# Patient Record
Sex: Female | Born: 1965 | Race: Black or African American | Hispanic: No | Smoking: Never smoker
Health system: Southern US, Community
[De-identification: ages and names within clinical notes are randomized; demographics above are authoritative.]

## PROBLEM LIST (undated history)

## (undated) DIAGNOSIS — G43809 Other migraine, not intractable, without status migrainosus: Secondary | ICD-10-CM

## (undated) DIAGNOSIS — G8929 Other chronic pain: Secondary | ICD-10-CM

## (undated) DIAGNOSIS — K219 Gastro-esophageal reflux disease without esophagitis: Secondary | ICD-10-CM

## (undated) DIAGNOSIS — I10 Essential (primary) hypertension: Secondary | ICD-10-CM

## (undated) DIAGNOSIS — M545 Low back pain, unspecified: Secondary | ICD-10-CM

## (undated) DIAGNOSIS — A6000 Herpesviral infection of urogenital system, unspecified: Secondary | ICD-10-CM

## (undated) DIAGNOSIS — J4520 Mild intermittent asthma, uncomplicated: Secondary | ICD-10-CM

## (undated) HISTORY — DX: Gastro-esophageal reflux disease without esophagitis: K21.9

## (undated) HISTORY — DX: Other chronic pain: G89.29

## (undated) HISTORY — DX: Other migraine, not intractable, without status migrainosus: G43.809

## (undated) HISTORY — DX: Herpesviral infection of urogenital system, unspecified: A60.00

## (undated) HISTORY — DX: Mild intermittent asthma, uncomplicated: J45.20

## (undated) HISTORY — DX: Essential (primary) hypertension: I10

## (undated) HISTORY — DX: Low back pain, unspecified: M54.50

## (undated) HISTORY — DX: Low back pain: M54.5

---

## 2004-01-28 ENCOUNTER — Ambulatory Visit: Payer: Self-pay

## 2004-06-30 ENCOUNTER — Ambulatory Visit: Payer: Self-pay | Admitting: Internal Medicine

## 2004-07-29 ENCOUNTER — Ambulatory Visit: Payer: Self-pay

## 2004-10-28 ENCOUNTER — Ambulatory Visit: Payer: Self-pay | Admitting: Internal Medicine

## 2004-12-08 ENCOUNTER — Ambulatory Visit: Payer: Self-pay | Admitting: Internal Medicine

## 2005-07-05 ENCOUNTER — Ambulatory Visit: Payer: Self-pay | Admitting: Family Medicine

## 2005-09-16 ENCOUNTER — Ambulatory Visit: Payer: Self-pay | Admitting: Unknown Physician Specialty

## 2006-10-21 ENCOUNTER — Other Ambulatory Visit: Payer: Self-pay

## 2006-10-21 ENCOUNTER — Emergency Department: Payer: Self-pay | Admitting: Emergency Medicine

## 2006-11-30 DIAGNOSIS — G8929 Other chronic pain: Secondary | ICD-10-CM | POA: Insufficient documentation

## 2007-04-14 LAB — HM MAMMOGRAPHY: HM Mammogram: NORMAL

## 2009-04-27 ENCOUNTER — Emergency Department: Payer: Self-pay | Admitting: Emergency Medicine

## 2011-07-16 ENCOUNTER — Emergency Department: Payer: Self-pay | Admitting: Emergency Medicine

## 2011-07-16 LAB — COMPREHENSIVE METABOLIC PANEL WITH GFR
Albumin: 3.8 g/dL
Alkaline Phosphatase: 61 U/L
Anion Gap: 9
BUN: 15 mg/dL
Bilirubin,Total: 0.3 mg/dL
Calcium, Total: 9 mg/dL
Chloride: 104 mmol/L
Co2: 27 mmol/L
Creatinine: 0.93 mg/dL
EGFR (African American): >60
EGFR (Non-African Amer.): >60
Glucose: 91 mg/dL
Osmolality: 280
Potassium: 3.7 mmol/L
SGOT(AST): 25 U/L
SGPT (ALT): 23 U/L
Sodium: 140 mmol/L
Total Protein: 8.2 g/dL

## 2011-07-16 LAB — CBC
HCT: 36.5 %
HGB: 12.1 g/dL
MCH: 26.8 pg
MCHC: 33.1 g/dL
MCV: 81 fL
Platelet: 245 10*3/uL
RBC: 4.52 X10 6/mm 3
RDW: 14.3 %
WBC: 5.6 10*3/uL

## 2011-07-16 LAB — CK TOTAL AND CKMB (NOT AT ARMC): CK, Total: 244 U/L — ABNORMAL HIGH (ref 21–215)

## 2012-06-12 LAB — HM PAP SMEAR: HM PAP: NORMAL

## 2012-06-20 ENCOUNTER — Emergency Department: Payer: Self-pay | Admitting: Emergency Medicine

## 2012-06-21 LAB — CBC
MCHC: 32.1 g/dL (ref 32.0–36.0)
MCV: 81 fL (ref 80–100)
RBC: 4.14 10*6/uL (ref 3.80–5.20)
RDW: 15.4 % — ABNORMAL HIGH (ref 11.5–14.5)

## 2012-06-21 LAB — BASIC METABOLIC PANEL
Calcium, Total: 8.6 mg/dL (ref 8.5–10.1)
Creatinine: 0.7 mg/dL (ref 0.60–1.30)
EGFR (African American): >60
EGFR (Non-African Amer.): >60
Osmolality: 275 (ref 275–301)
Potassium: 3.2 mmol/L — ABNORMAL LOW (ref 3.5–5.1)
Sodium: 138 mmol/L (ref 136–145)

## 2012-06-21 LAB — CK TOTAL AND CKMB (NOT AT ARMC): CK-MB: 2 ng/mL (ref 0.5–3.6)

## 2012-11-01 ENCOUNTER — Emergency Department: Payer: Self-pay | Admitting: Emergency Medicine

## 2012-12-08 ENCOUNTER — Observation Stay: Payer: Self-pay | Admitting: Internal Medicine

## 2012-12-08 LAB — BASIC METABOLIC PANEL
Chloride: 103 mmol/L (ref 98–107)
EGFR (African American): >60
EGFR (Non-African Amer.): >60
Osmolality: 274 (ref 275–301)
Sodium: 136 mmol/L (ref 136–145)

## 2012-12-08 LAB — TROPONIN I
Troponin-I: <0.02 ng/mL
Troponin-I: <0.02 ng/mL

## 2012-12-08 LAB — CBC
HCT: 37.4 % (ref 35.0–47.0)
HGB: 12.4 g/dL (ref 12.0–16.0)
MCH: 26.4 pg (ref 26.0–34.0)
MCHC: 33.2 g/dL (ref 32.0–36.0)
MCV: 80 fL (ref 80–100)
RBC: 4.69 10*6/uL (ref 3.80–5.20)
RDW: 14.3 % (ref 11.5–14.5)

## 2012-12-08 LAB — HEPATIC FUNCTION PANEL A (ARMC)
Albumin: 3.9 g/dL (ref 3.4–5.0)
Bilirubin, Direct: <0.1 mg/dL (ref 0.00–0.20)

## 2012-12-09 LAB — LIPID PANEL
Ldl Cholesterol, Calc: 99 mg/dL (ref 0–100)
VLDL Cholesterol, Calc: 44 mg/dL — ABNORMAL HIGH (ref 5–40)

## 2012-12-09 LAB — COMPREHENSIVE METABOLIC PANEL
Alkaline Phosphatase: 67 U/L (ref 50–136)
Anion Gap: 7 (ref 7–16)
BUN: 13 mg/dL (ref 7–18)
Calcium, Total: 9 mg/dL (ref 8.5–10.1)
Chloride: 102 mmol/L (ref 98–107)
Creatinine: 0.86 mg/dL (ref 0.60–1.30)
EGFR (African American): >60
EGFR (Non-African Amer.): >60
Glucose: 149 mg/dL — ABNORMAL HIGH (ref 65–99)
Osmolality: 273 (ref 275–301)
Potassium: 3.4 mmol/L — ABNORMAL LOW (ref 3.5–5.1)
Total Protein: 7.3 g/dL (ref 6.4–8.2)

## 2012-12-09 LAB — CBC WITH DIFFERENTIAL/PLATELET
Basophil #: 0.1 10*3/uL (ref 0.0–0.1)
Eosinophil #: 0.3 10*3/uL (ref 0.0–0.7)
Lymphocyte #: 2.1 10*3/uL (ref 1.0–3.6)
Lymphocyte %: 32 %
Neutrophil %: 55.8 %
Platelet: 251 10*3/uL (ref 150–440)
RDW: 14.4 % (ref 11.5–14.5)
WBC: 6.2 10*3/uL (ref 3.6–11.0)

## 2012-12-09 LAB — TSH: Thyroid Stimulating Horm: 0.802 u[IU]/mL

## 2013-05-18 LAB — HEPATIC FUNCTION PANEL
ALT: 17 U/L (ref 7–35)
AST: 22 U/L (ref 13–35)
Alkaline Phosphatase: 54 U/L (ref 25–125)
Bilirubin, Total: 0.4 mg/dL

## 2013-05-18 LAB — TSH: TSH: 0.5 u[IU]/mL (ref 0.41–5.90)

## 2013-05-18 LAB — BASIC METABOLIC PANEL
BUN: 14 mg/dL (ref 4–21)
Creatinine: 0.8 mg/dL (ref 0.5–1.1)
Glucose: 98 mg/dL
Potassium: 4.5 mmol/L (ref 3.4–5.3)
Sodium: 138 mmol/L (ref 137–147)

## 2013-05-18 LAB — LIPID PANEL
CHOLESTEROL: 201 mg/dL — AB (ref 0–200)
HDL: 52 mg/dL (ref 35–70)
LDL Cholesterol: 134 mg/dL
LDl/HDL Ratio: 2.6
Triglycerides: 75 mg/dL (ref 40–160)

## 2013-05-18 LAB — CBC AND DIFFERENTIAL
HEMATOCRIT: 37 % (ref 36–46)
Hemoglobin: 12.4 g/dL (ref 12.0–16.0)
Neutrophils Absolute: 61 /uL
Platelets: 294 10*3/uL (ref 150–399)
WBC: 4.7 10*3/mL

## 2013-05-18 LAB — HEMOGLOBIN A1C: HEMOGLOBIN A1C: 6.1 % — AB (ref 4.0–6.0)

## 2013-05-21 IMAGING — CR DG CHEST 2V
1 series · 2 of 2 positions shown · non-contrast
Comparison: none

REASON FOR EXAM: Shortness of Breath
COMMENTS:   May transport without cardiac monitor

PROCEDURE:     DXR - DXR CHEST PA (OR AP) AND LATERAL  - July 16, 2011  [DATE]
RESULT:     Comparison: None.

[Series 1: w chest pa · 0.14mm/px · 2 of 2 slices shown]
[im 1/2]
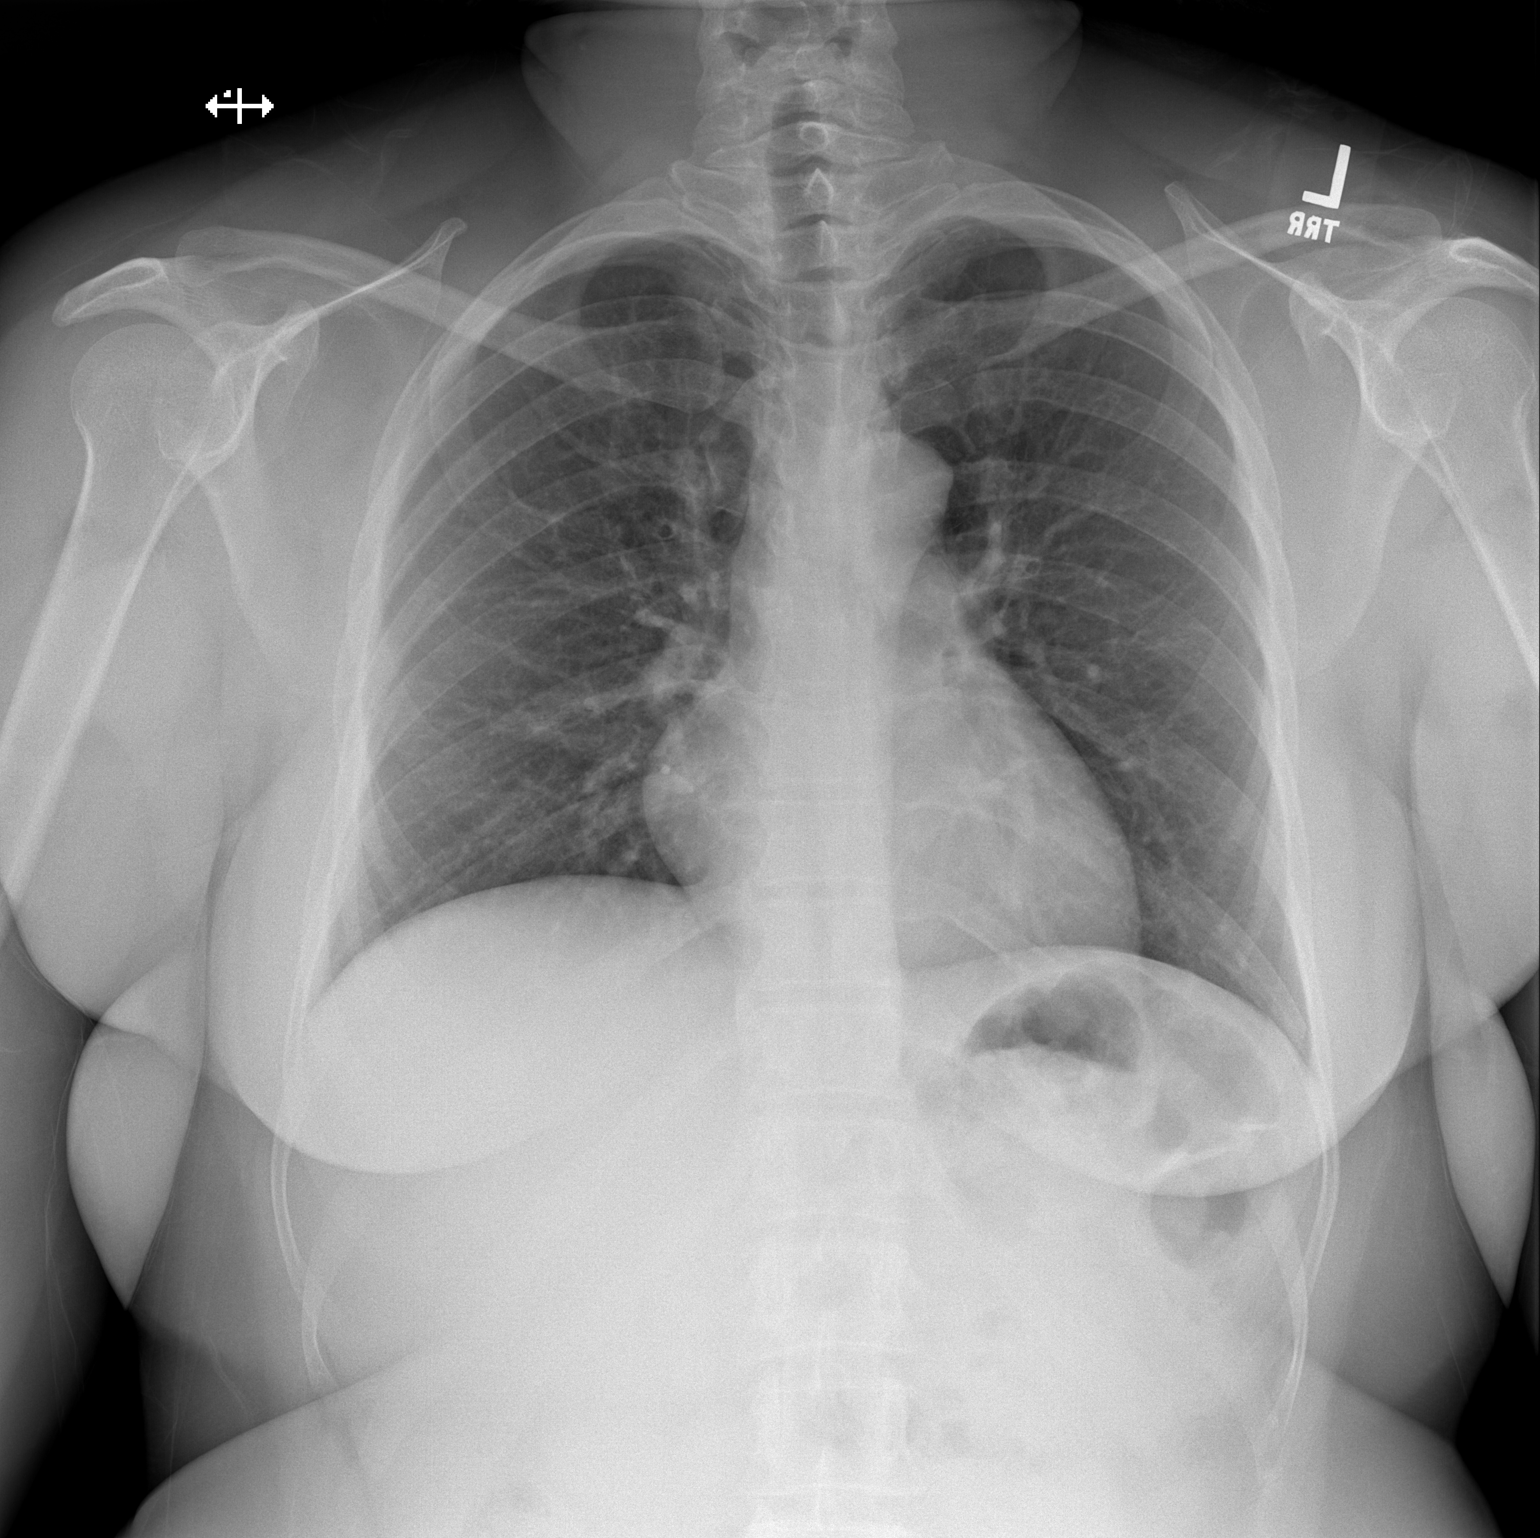
[im 2/2]
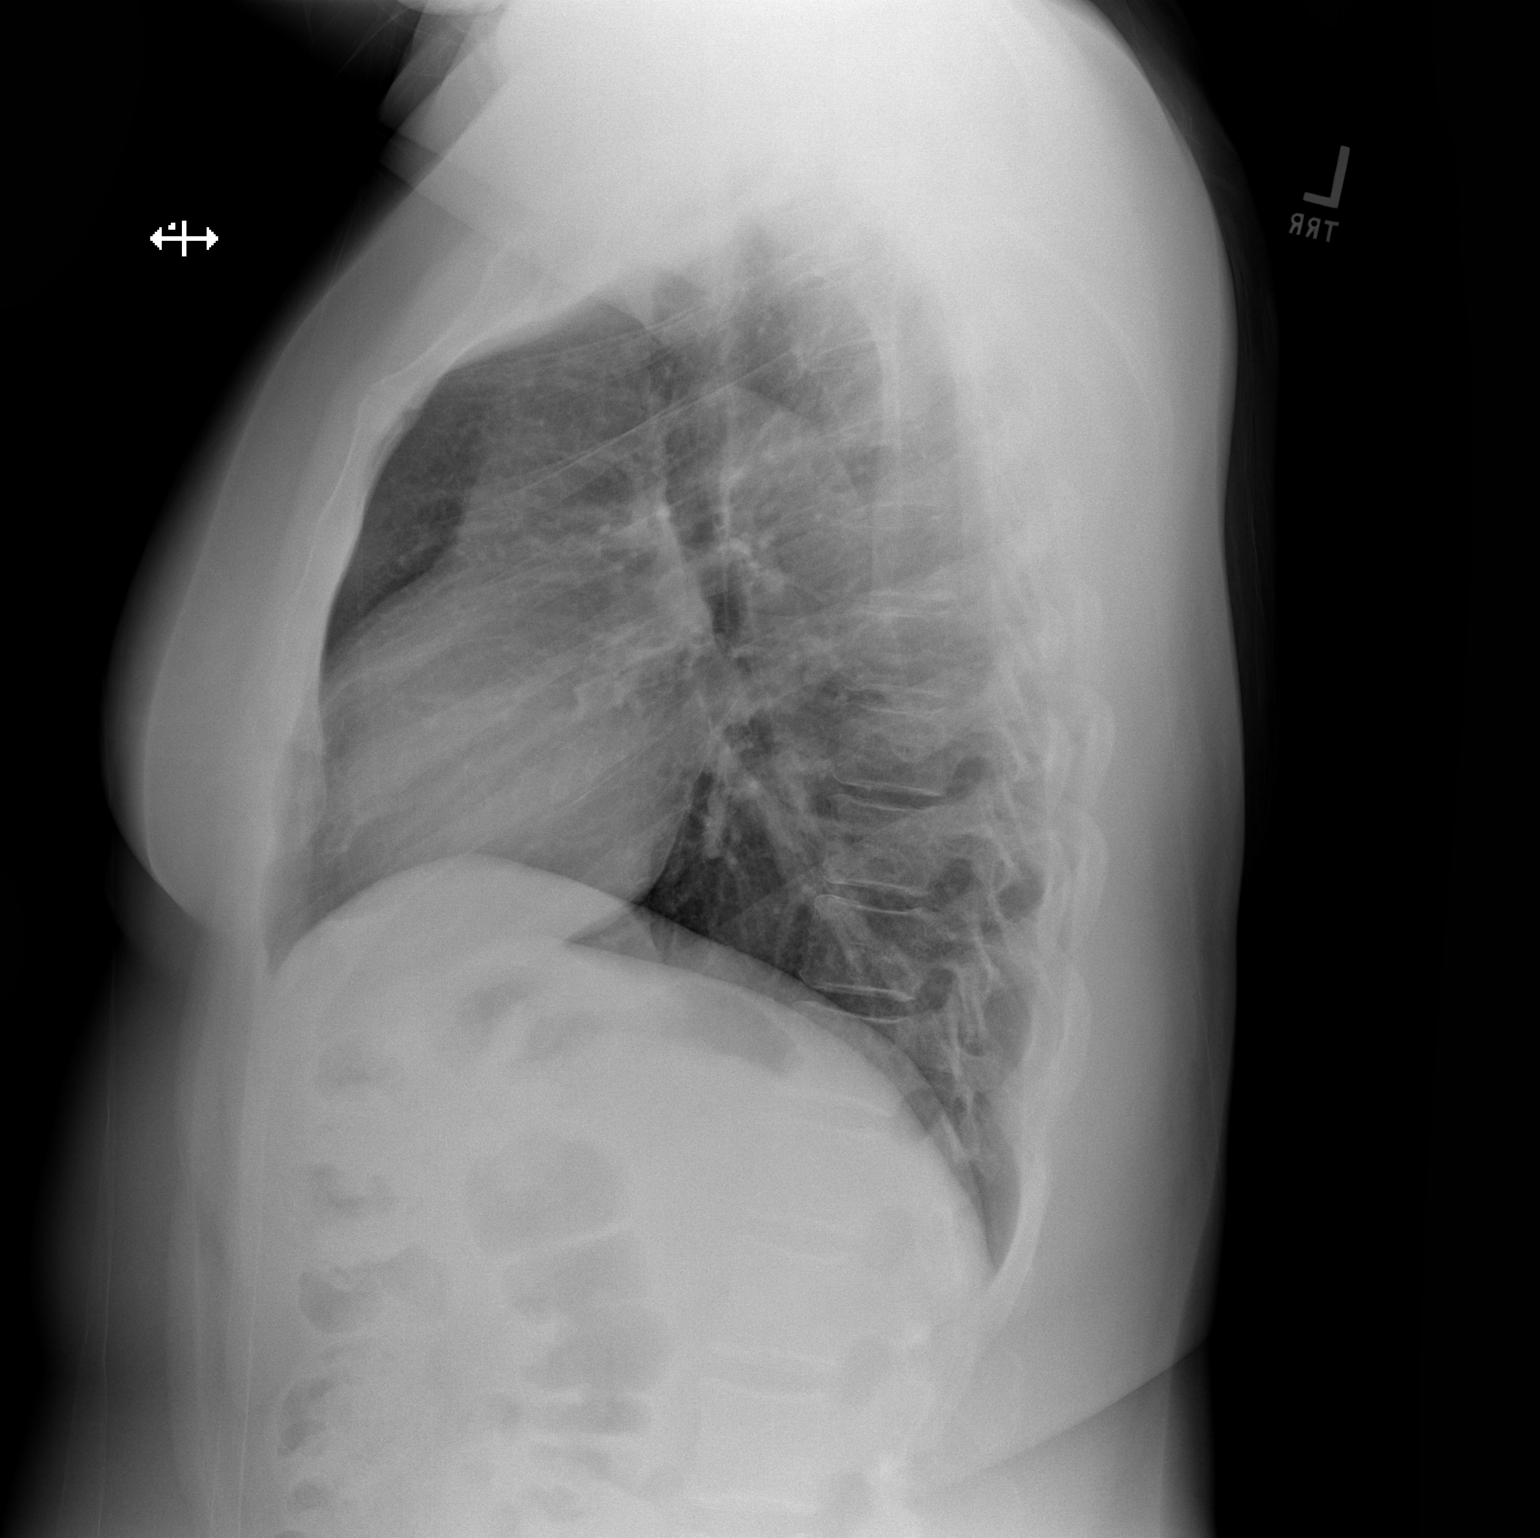

[2 of 2 positions shown; findings below may reference images not displayed]

FINDINGS: The heart and mediastinum are within normal limits. No focal pulmonary
opacities.
IMPRESSION: No acute cardiopulmonary disease.

## 2014-01-02 ENCOUNTER — Ambulatory Visit: Payer: Self-pay | Admitting: Family Medicine

## 2014-08-02 NOTE — Discharge Summary (Signed)
PATIENT NAME:  Paula Yang, [name] MR#:  [id number] DATE OF BIRTH:  [birth year]  DATE OF ADMISSION:  12/08/2012 DATE OF DISCHARGE:  12/09/2012  ADMITTING DIAGNOSIS:  Chest pain and shortness of breath.   DISCHARGE DIAGNOSES:   1.  Chest pain, noncardiac, likely suspected gastroesophageal reflux disease.  2.  Dyspnea with history of asthma, although good oxygenation in the hospital.  3.  Hypertension, well-controlled. 4.  History of migraine headaches.  5.  History of genital herpes.   DISCHARGE CONDITION:  Stable.   DISCHARGE MEDICATIONS:  The patient is to resume her outpatient medications which are hydrochlorothiazide lisinopril 25/20 1 tablet once daily, acyclovir 400 mg twice daily, albuterol 2 puffs 4 times daily as needed, Prilosec 40 mg 1 capsule twice a day.  Prilosec dose is being advanced.   OXYGEN:  None.   DIET:  2 gram salt, regular consistency.   ACTIVITY LIMITATIONS:  As tolerated.    FOLLOW-UP APPOINTMENTS:  Stroke clinic in two days after discharge, Dr. Servando SnareWohl gastroenterology in the next one week after discharge.   CONSULTANTS:  Care management.   RADIOLOGIC STUDIES:  Chest, PA and lateral, 12/08/2012, showed no evidence of acute cardiopulmonary abnormality.  Mild stable prominence of the pulmonary interstitial markings especially on the right were present.  Myocardial nuclear study 12/08/2012 revealed no significant wall motion abnormality.  Pharmacological myocardial perfusion study with no significant ischemia.  The estimated ejection fraction was 50%.  There were no EKG changes concerning for ischemia and no artifact noted on this study, read by Dr. Darrold JunkerParaschos.  Echocardiogram performed, however results are still pending at the time of dictation.   HOSPITAL COURSE:  The patient is a 49 year old Caucasian female with past medical history significant for history of hypertension who presents to the hospital with complaints of shortness of breath as well as chest pain.   Please refer to Dr. Mikki SanteeSanchez-Gutierrez's admission note on 12/08/2012.  On arrival to the hospital, the patient's vital signs showed blood pressure 131/72, pulse was 74, respiratory rate was 20, temperature was 98.1.  O2 sats were 99% on room air.  Physical exam was unremarkable, however the patient on palpation of the chest, it was painful and pain was reproducible.  The patient's EKG done in the Emergency Room showed normal sinus rhythm.  No ST-T changes.  Mild LVH.  The patient's lab data done in the Emergency Room showed elevation of glucose to 125.  Beta-type natriuretic peptide was 13, otherwise BMP was normal.  The patient's liver enzymes were normal.  Cardiac enzymes x 4 were within normal limits.  TSH was normal at 0.8 (Dictation Anomaly) .  CBC was within normal limits and erythrocyte sedimentation rate was normal at 14.  The patient was admitted to the hospital for further evaluation.  Her cardiac enzymes were cycled and Myoview stress test was ordered.  The patient underwent Myoview stress test on 12/08/2012, which was unremarkable.  She was advised to ambulate in the hallways and she was able to do that without any significant discomfort.  Her O2 sats on exertion were 99%.  It was felt that the patient's chest pain was very likely gastrointestinal as patient was complaining of some discomfort and gastroesophageal acid reflux and even into her throat as well as some discomfort in her epigastrium area and bloating after she eats as well as early satiety.  It was felt that the patient would benefit from advancement of PPI, Prilosec to 40 mg twice daily dose and consultation with gastroenterologist  as outpatient.  The patient is being discharged in stable condition with the above-mentioned medications and follow-up.    Her vital signs on the day of discharge:  Temperature 97.9, pulse was 59, respiration rate was 18, blood pressure range from 97 to 127 systolic, 60s to 14N diastolic and O2 sats were 98% to  99% on room air at rest as well as on exertion.   TIME SPENT:  40 minutes.     ____________________________ Katharina Caper, MD rv:ea D: 12/09/2012 15:41:56 ET T: 12/10/2012 02:01:56 ET JOB#: [id number]  cc: Katharina Caper, MD, <Dictator> [facility] Midge Minium, MD Jermiah Soderman Winona Legato MD ELECTRONICALLY SIGNED 01/08/2013 8:24

## 2014-08-02 NOTE — H&P (Signed)
PATIENT NAME:  Paula Yang, Paula Yang MR#:  [id number] DATE OF BIRTH:  [birth year]  DATE OF ADMISSION:  12/08/2012  CHIEF COMPLAINT: Shortness of breath and chest pain.   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 The patient has a history of migraines chronically in the past. She has hypertension, asthma and history of genital herpes. The patient has been struggling with shortness of breath for over 5 years on and off. In the past, she has been seen by Dr. Clayborn Bigness, but she does not really have any followup with that. The patient comes in today mostly because the dyspnea was really severe yesterday. The patient states that she is not able to walk more than 50 to 65 feet in the house without getting absolutely tired and short of breath. She states that occasionally she can do more than that. If she is working, she can do baths to people and even carrying them around without shortness of breath. Occasionally, she does get a little bit more tired. She states that she can go to the store, push the cart full of groceries and complete the task, but occasionally gets short of breath, but not on every single time. Apparently, yesterday at 9:00 p.m., she started having some increased shortness of breath while walking. She went and laid down, and she had chest pain. Her chest pain usually happens on and off, but it has not been going on for a long time. She states that the pain usually happens whenever she is eating or lying down. Location is on the left sternal border. It feels like somebody punched her on the chest at the beginning, and then changes characteristics to pins and needles. She associates shortness of breath to that. There was no radiation to the pain. The pain lasts for 15 seconds, goes away and then comes back and  is associated with dizziness. She was not diaphoretic. The patient states that in general she sleeps with 2 pillows because if she is flat in bed, she is going to have significant shortness of breath. The patient is evaluated in the ER. She has normal cardiac enzymes and normal EKG. Her chest x-ray shows some changes associated with enlarged right ventricle and possible enlarged left atrium. The EKG shows mild LVH. No ST depression or elevation. Normal sinus rhythm.   REVIEW OF SYSTEMS: A 12-system review of systems done. CONSTITUTIONAL: No fever. Positive occasional fatigue and weakness. Negative weight loss or weight gain.  EYES: No blurry vision, double vision or changes in acuity.  EARS, NOSE, THROAT: No difficulty swallowing. Positive nasal congestion with sinus problems on and off. Negative difficulty swallowing.  RESPIRATORY: No cough. Occasional wheezing, occasional asthma, occasional secretions. Positive exertional dyspnea.  CARDIOVASCULAR: Negative syncope. Negative palpitations. Occasional edema of the lower extremities, hands, knuckles and fingers.   GASTROINTESTINAL: No nausea, vomiting, abdominal pain, constipation or diarrhea.  GENITOURINARY: No dysuria, hematuria or changes in frequency.  GYNECOLOGIC: No breast masses.  ENDOCRINOLOGY: No polyuria, polydipsia, polyphagia, cold or heat intolerance. HEMATOLOGIC AND LYMPHATIC: No anemia, easy bruising or swollen glands.  MUSCULOSKELETAL: No significant neck pain, back pain or joint deformity. The patient has no swelling of joints.  PSYCHIATRIC: No anxiety or depression. The patient states she was really anxious and dealing with a lot of personal situations 6  months ago, but they have resolved.  NEUROLOGIC: No numbness or tingling. Positive chronic migraines.   PAST MEDICAL HISTORY:  1. Migraines, which happen every 2 to 3 weeks. Taken off Topamax because it was making it worse.  2. Hypertension.  3. Asthma diagnosed at the age of 66.   76. Genital herpes.   ALLERGIES: No known drug allergies.   PAST SURGICAL HISTORY: The patient had a C-section x1 for twins.   FAMILY HISTORY: MI in her father at the age of 75, it was massive, and he died. Her grandmother also had a heart attack that killed her. There is no CVA in the family. Cancer: Positive for breast in her grandmother and colon in her grandfather. Diabetes in mom and grandmother.   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 She does not smoke, never has. She does not drink. She does not use drugs. The patient lives with her son and her granddaughter.   MEDICATIONS:  1. Lisinopril/hydrochlorothiazide 20/25 mg daily. 2. Acyclovir 400 mg twice daily.  3. Albuterol/Proventil as needed for shortness of breath.   PHYSICAL EXAMINATION:  VITAL SIGNS: Blood pressure 131/72, pulse 74, respirations 20, temperature 98.1, oxygen saturation 99% on room air.  GENERAL: The patient is alert, oriented x3, in no acute distress. No respiratory distress. Hemodynamically stable.  HEENT: The pupils are equal and reactive. Extraocular movements are intact. Anicteric sclerae. Pink conjunctivae. No oral lesions. No oropharyngeal exudates.  NECK: Supple. No JVD. No thyromegaly. No adenopathy. No carotid bruits. No rigidity.  CARDIOVASCULAR AND CHEST: Regular rate and rhythm. No murmurs, rubs or gallops are appreciated. Positive significant tenderness to palpation of the lateral area of the sternum within the cartilage of the sternum. The patient feels like the pain is exactly the same as the one that she was having yesterday. The patient also states that this pain is new, and she never noticed the pain to be reproducible. There is no displacement of PMI.  LUNGS: Clear, without any wheezing or crepitus. No use of accessory muscles. No wheezing.  ABDOMEN: Soft, nontender, nondistended. No hepatosplenomegaly. No masses. Bowel sounds are positive.  GENITAL: Deferred.  EXTREMITIES:  No edema, cyanosis, no clubbing. Pulses +2. Capillary refill less than 3.  NEUROLOGIC: Cranial nerves II through XII intact. Strength is 5 out of 5 in all 4 extremities. DTRs +2.  PSYCHIATRIC: Negative for significant agitation, anxiety, depression. The patient has normal mood.  LYMPHATIC: Negative for lymphadenopathy in neck or supraclavicular area.  SKIN: Without any rashes, petechiae or new lesions.  MUSCULOSKELETAL: No significant joint deformity or joint effusions.   RESULTS: Overall, no significant findings. Glucose is 125. The patient does not have diabetes. Creatinine is 0.75, potassium 3.5, AST 24, ALT 23. Troponin 0.02 x2. Hemoglobin is 12, white count 5.8, platelet count 260. EKG, as mentioned above, Normal sinus rhythm. No ST depression or elevation. Mild LVH. Chest x-ray actually has some changes with mild stable prominence of the pulmonary interstitial markings, especially on the right side. The patient has right ventricle that to me looks prominent too. We are going to get an echo to evaluate pulmonary hypertension as a primary diagnosis.   ASSESSMENT AND PLAN: This is a 49 year old female who presented with hypertension, genital herpes, migraines, with significant shortness of breath for 5 years, getting worse, associated with chest pain.   1. Chest pain. I think the chest pain is reproducible. It is likely to be musculoskeletal. We are going to try to  evaluate it a little bit more, since the problem seems to be recurrent with the shortness of breath, and get a Myoview scan. Cardiac enzymes x3 and evaluate the possibility of other causes of the pain.  2. Dyspnea. The patient has significant exertional dyspnea and occasional orthopnea. I think this problem is probably more significant than the chest pain. As mentioned above, the chest pain was reproducible, although this could be secondary to a primary lung disease as the patient has been diagnosed with what "asthma" at the age of 49 and  getting worse since then, and she has been having shortness of breath for 5 years. There are some changes with increased vascular markings and also what appears to be an enlarged right ventricle. We are going to check echocardiogram to evaluate right-side pressures. If they are high, consider the possibility of pulmonary consultation. We are going to add on PFTs as well. There is no history of congestive heart failure, but in the meantime, we are going to continue hydrochlorothiazide and monitor her weight. I am going to add a BNP to her labs. I am going to add an ANA and an ESR, and depending on the results, continue to follow up and see if there is any other testing that needs to be done. In the meantime, symptomatic treatment with albuterol.  3. Asthma, as mentioned above, diagnosed at the age of 74 or 82. The onset could be what we call cardiac asthma or could be any other pathology creating similar symptoms.  4. Hypertension. Continue control with lisinopril and hydrochlorothiazide.  5. Elevated blood sugar. Check hemoglobin A1c and monitor closely.  6. Genital herpes. Continue acyclovir.  7. Deep vein thrombosis prophylaxis with heparin.  8. Gastrointestinal prophylaxis with Protonix.  9. Continue aspirin for prevention of coronary artery disease. At this moment, I am not going to increase the dose of aspirin, just leave it at 81 mg.  CODE STATUS: The patient is a full code.   TIME SPENT: I spent about 50 minutes with this admission.   ____________________________ Halsey Sink, MD rsg:OSi D: 12/08/2012 12:14:55 ET T: 12/08/2012 12:55:41 ET JOB#: [id number]  cc: Yreka Sink, MD, <Dictator> ROBERTO America Brown MD ELECTRONICALLY SIGNED 12/12/2012 11:52

## 2014-08-05 DIAGNOSIS — A6000 Herpesviral infection of urogenital system, unspecified: Secondary | ICD-10-CM | POA: Insufficient documentation

## 2014-08-05 DIAGNOSIS — K219 Gastro-esophageal reflux disease without esophagitis: Secondary | ICD-10-CM | POA: Insufficient documentation

## 2014-08-05 DIAGNOSIS — I10 Essential (primary) hypertension: Secondary | ICD-10-CM | POA: Insufficient documentation

## 2014-08-05 DIAGNOSIS — J4520 Mild intermittent asthma, uncomplicated: Secondary | ICD-10-CM | POA: Insufficient documentation

## 2014-08-05 DIAGNOSIS — Z0000 Encounter for general adult medical examination without abnormal findings: Secondary | ICD-10-CM | POA: Insufficient documentation

## 2014-08-05 DIAGNOSIS — J309 Allergic rhinitis, unspecified: Secondary | ICD-10-CM | POA: Insufficient documentation

## 2014-08-05 DIAGNOSIS — B9789 Other viral agents as the cause of diseases classified elsewhere: Secondary | ICD-10-CM | POA: Insufficient documentation

## 2014-08-05 DIAGNOSIS — Z124 Encounter for screening for malignant neoplasm of cervix: Secondary | ICD-10-CM | POA: Insufficient documentation

## 2014-08-05 DIAGNOSIS — Z1239 Encounter for other screening for malignant neoplasm of breast: Secondary | ICD-10-CM | POA: Insufficient documentation

## 2014-08-05 DIAGNOSIS — IMO0002 Reserved for concepts with insufficient information to code with codable children: Secondary | ICD-10-CM | POA: Insufficient documentation

## 2014-08-05 DIAGNOSIS — L03019 Cellulitis of unspecified finger: Secondary | ICD-10-CM

## 2014-08-05 DIAGNOSIS — L918 Other hypertrophic disorders of the skin: Secondary | ICD-10-CM | POA: Insufficient documentation

## 2014-08-05 DIAGNOSIS — Z87898 Personal history of other specified conditions: Secondary | ICD-10-CM | POA: Insufficient documentation

## 2014-08-05 DIAGNOSIS — G43809 Other migraine, not intractable, without status migrainosus: Secondary | ICD-10-CM | POA: Insufficient documentation

## 2014-08-05 DIAGNOSIS — J329 Chronic sinusitis, unspecified: Secondary | ICD-10-CM

## 2014-08-05 DIAGNOSIS — R109 Unspecified abdominal pain: Secondary | ICD-10-CM | POA: Insufficient documentation

## 2014-09-27 ENCOUNTER — Ambulatory Visit (INDEPENDENT_AMBULATORY_CARE_PROVIDER_SITE_OTHER): Payer: 59 | Admitting: Family Medicine

## 2014-09-27 ENCOUNTER — Encounter: Payer: Self-pay | Admitting: Family Medicine

## 2014-09-27 VITALS — BP 124/78 | HR 76 | Temp 98.7°F | Resp 16 | Ht 67.0 in | Wt 226.5 lb

## 2014-09-27 DIAGNOSIS — I10 Essential (primary) hypertension: Secondary | ICD-10-CM | POA: Diagnosis not present

## 2014-09-27 DIAGNOSIS — M549 Dorsalgia, unspecified: Secondary | ICD-10-CM

## 2014-09-27 DIAGNOSIS — G8929 Other chronic pain: Secondary | ICD-10-CM

## 2014-09-27 DIAGNOSIS — M7712 Lateral epicondylitis, left elbow: Secondary | ICD-10-CM | POA: Diagnosis not present

## 2014-09-27 MED ORDER — HYDROCODONE-ACETAMINOPHEN 5-325 MG PO TABS: 1.0000 | ORAL_TABLET | Freq: Two times a day (BID) | ORAL | Status: DC | PRN

## 2014-09-27 MED ORDER — LISINOPRIL-HYDROCHLOROTHIAZIDE 20-25 MG PO TABS: 1.0000 | ORAL_TABLET | Freq: Every day | ORAL | Status: DC

## 2014-09-27 MED ORDER — PREDNISONE 10 MG (21) PO TBPK: ORAL_TABLET | ORAL | Status: DC

## 2014-09-27 NOTE — Patient Instructions (Signed)

## 2014-09-27 NOTE — Progress Notes (Signed)
Name: Paula Yang   MRN: [id number]    DOB: [birth year]   Date:09/27/2014       Progress Note  Subjective  Chief Complaint  Chief Complaint  Patient presents with  . Back Pain    patient is here for her 2 month follow up and refill of medication  . Arm Pain    patient also presents with left arm pain that started about 1 month ago    HPI  Hypertension This is a chronic problem. The current episode started more than 1 year ago. The problem is unchanged. The problem is controlled. Pertinent negatives include no anxiety, blurred vision, chest pain, headaches, malaise/fatigue, orthopnea, palpitations, peripheral edema or shortness of breath. Agents associated with hypertension include NSAIDs. Risk factors for coronary artery disease include dyslipidemia, obesity and sedentary lifestyle. Past treatments include diuretics and ACE inhibitors. Compliance problems include exercise and diet.  There is no history of kidney disease, heart failure or a thyroid problem. There is no history of chronic renal disease.   Joint/Muscle Pain: Patient complains of arthralgias for which has been present for several years. Pain is located in lumbar spine, is described as aching, and is intermittent .  Associated symptoms include: decreased range of motion.  The patient has tried opioid, NSAID, exercises..  Related to injury:   no.  Patient Active Problem List   Diagnosis Date Noted  . Abdominal pain 08/05/2014  . Allergic rhinitis 08/05/2014  . Routine general medical examination at a health care facility 08/05/2014  . Gastro-esophageal reflux disease without esophagitis 08/05/2014  . Genital herpes simplex type 2 08/05/2014  . Gravida 1 08/05/2014  . Hangnail 08/05/2014  . Headache, variant migraine 08/05/2014  . BP (high blood pressure) 08/05/2014  . Asthma, mild intermittent 08/05/2014  . Parity 1 08/05/2014  . Breast screening 08/05/2014  . Pap smear for cervical cancer screening 08/05/2014  .  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 Viral sinusitis 08/05/2014  . Back pain, chronic 11/30/2006    History  Substance Use Topics  . Smoking status: Never Smoker   . Smokeless tobacco: Not on file  . Alcohol Use: No     Current outpatient prescriptions:  .  acyclovir (ZOVIRAX) 400 MG tablet, Take by mouth., Disp: , Rfl:  .  albuterol (PROVENTIL HFA;VENTOLIN HFA) 108 (90 BASE) MCG/ACT inhaler, Inhale into the lungs., Disp: , Rfl:  .  fluticasone (FLONASE) 50 MCG/ACT nasal spray, Place into the nose., Disp: , Rfl:  .  HYDROcodone-acetaminophen (NORCO/VICODIN) 5-325 MG per tablet, Take by mouth., Disp: , Rfl:  .  lisinopril-hydrochlorothiazide (PRINZIDE,ZESTORETIC) 20-25 MG per tablet, Take by mouth., Disp: , Rfl:  .  omeprazole (PRILOSEC) 40 MG capsule, Take by mouth., Disp: , Rfl:   Past Surgical History  Procedure Laterality Date  . Cesarean section  1991    Family History  Problem Relation Age of Onset  . Heart disease Brother   . Heart disease Maternal Aunt   . Diabetes Mother   . Diabetes Maternal Aunt   . Diabetes Maternal Uncle   . Diabetes Maternal Grandmother   . Breast cancer Maternal Grandmother   . Breast cancer Maternal Aunt     No Known Allergies   Review of Systems  CONSTITUTIONAL: No significant weight changes, fever, chills, weakness or fatigue.  HEENT:  - Eyes: No visual changes.  - Ears: No auditory changes. No pain.  - Nose: No sneezing, congestion, runny nose. - Throat: No sore throat. No changes in swallowing. SKIN: No  rash or itching.  CARDIOVASCULAR: No chest pain, chest pressure or chest discomfort. No palpitations or edema.  RESPIRATORY: No shortness of breath, cough or sputum.  GASTROINTESTINAL: No anorexia, nausea, vomiting. No changes in bowel habits. No abdominal pain or blood.  GENITOURINARY: No dysuria. No frequency. No discharge.  NEUROLOGICAL: No headache, dizziness, syncope, paralysis, ataxia, numbness or tingling in the extremities. No  memory changes. No change in bowel or bladder control.  MUSCULOSKELETAL: Usual back pain with new left elbow pain. No muscle pain. HEMATOLOGIC: No anemia, bleeding or bruising.  LYMPHATICS: No enlarged lymph nodes.  PSYCHIATRIC: No change in mood. No change in sleep pattern.  ENDOCRINOLOGIC: No reports of sweating, cold or heat intolerance. No polyuria or polydipsia.      Objective  BP 124/78 mmHg  Pulse 76  Temp(Src) 98.7 F (37.1 C) (Oral)  Resp 16  Ht  (1.702 m)  Wt 226 lb 8 oz (102.74 kg)  BMI 35.47 kg/m2  SpO2 98%  LMP 09/15/2014 (Exact Date)  Physical Exam  Constitutional: Patient is obese and well-nourished. In no distress.  HEENT:  - Head: Normocephalic and atraumatic.  - Ears: Bilateral TMs gray, no erythema or effusion - Nose: Nasal mucosa moist - Mouth/Throat: Oropharynx is clear and moist. No tonsillar hypertrophy or erythema. No post nasal drainage.  - Eyes: Conjunctivae clear, EOM movements normal. PERRLA. No scleral icterus.  Neck: Normal range of motion. Neck supple. No JVD present. No thyromegaly present.  Cardiovascular: Normal rate, regular rhythm and normal heart sounds.  No murmur heard.  Pulmonary/Chest: Effort normal and breath sounds normal. No respiratory distress. Musculoskeletal: Normal range of motion bilateral UE and LE, no joint effusions. Left lateral epicondyle of elbow with tenderness and mild swelling. Peripheral vascular: Bilateral LE no edema. Neurological: CN II-XII grossly intact with no focal deficits. Alert and oriented to person, place, and time. Coordination, balance, strength, speech and gait are normal.  Skin: Skin is warm and dry. No rash noted. No erythema.  Psychiatric: Patient has a normal mood and affect. Behavior is normal in office today. Judgment and thought content normal in office today.   Assessment & Plan  1. Back pain, chronic Stable findings, refilled medication for 2 months.  The patient has been prescribed  a controled substance today under the agreement of a filed pain treatment regimen contract.  With use of this medication they verbalize understanding the potential risk of addiction, abuse, and misuse, which can lead to overdose and death. The patient may not obtain and use other illicit or controled substances from any other sources while under the aformentioned contract. A urine drug screen will be performed periodically and the patient's name will be reviewed on the  Controlled Substance Reporting System regularly.  The patient expresses understanding the above statement and agreement to comply.  The patient has been counseled on the proper use, side effects and potential interactions of the new medication with other prescribed and OTC medications. Under no circumstances is this (and any other) medication to be use with alcohol or other illicit drugs. This medication is not to be crushed, chewed, sniffed, injected or used in any other way other than what is stated in the directions. This medication is to be used at the frequency and quantity that is stated in the directions. This medication is to be used only by the individual who's name is on the prescription bottle. Drug sharing and selling is unacceptable. Patient voices understanding what has been said today.  -  HYDROcodone-acetaminophen (NORCO/VICODIN) 5-325 MG per tablet; Take 1 tablet by mouth 2 (two) times daily as needed for severe pain.  Dispense: 60 tablet; Refill: 0 - HYDROcodone-acetaminophen (NORCO/VICODIN) 5-325 MG per tablet; Take 1 tablet by mouth 2 (two) times daily as needed for severe pain.  Dispense: 60 tablet; Refill: 0  2. Hypertension goal BP (blood pressure) < 140/90 Well controled, refilled medications for 6 months. Due for blood work at CPE at next visit in 2 months.  - lisinopril-hydrochlorothiazide (PRINZIDE,ZESTORETIC) 20-25 MG per tablet; Take 1 tablet by mouth daily.  Dispense: 90 tablet; Refill: 1  3. Lateral  epicondylitis (tennis elbow), left She deferred intraarticular steroid shot today. Will do home exercises, tennis elbow brace, ice/heat alternating.   - HYDROcodone-acetaminophen (NORCO/VICODIN) 5-325 MG per tablet; Take 1 tablet by mouth 2 (two) times daily as needed for severe pain.  Dispense: 60 tablet; Refill: 0 - HYDROcodone-acetaminophen (NORCO/VICODIN) 5-325 MG per tablet; Take 1 tablet by mouth 2 (two) times daily as needed for severe pain.  Dispense: 60 tablet; Refill: 0 - predniSONE (STERAPRED UNI-PAK 21 TAB) 10 MG (21) TBPK tablet; Use as directed in a 6 day taper PredPak  Dispense: 21 tablet; Refill: 0

## 2014-12-03 ENCOUNTER — Ambulatory Visit: Payer: 59 | Admitting: Family Medicine

## 2014-12-31 ENCOUNTER — Ambulatory Visit: Payer: 59 | Admitting: Family Medicine

## 2015-01-14 ENCOUNTER — Other Ambulatory Visit: Payer: Self-pay | Admitting: Family Medicine

## 2015-02-11 ENCOUNTER — Encounter: Payer: Self-pay | Admitting: Emergency Medicine

## 2015-02-11 ENCOUNTER — Emergency Department
Admission: EM | Admit: 2015-02-11 | Discharge: 2015-02-12 | Disposition: A | Discharge status: Home / Self Care | Payer: Self-pay | Attending: Emergency Medicine | Admitting: Emergency Medicine

## 2015-02-11 DIAGNOSIS — I10 Essential (primary) hypertension: Secondary | ICD-10-CM | POA: Insufficient documentation

## 2015-02-11 DIAGNOSIS — Z7951 Long term (current) use of inhaled steroids: Secondary | ICD-10-CM | POA: Insufficient documentation

## 2015-02-11 DIAGNOSIS — Z7952 Long term (current) use of systemic steroids: Secondary | ICD-10-CM | POA: Insufficient documentation

## 2015-02-11 DIAGNOSIS — H538 Other visual disturbances: Secondary | ICD-10-CM | POA: Insufficient documentation

## 2015-02-11 NOTE — ED Notes (Addendum)
Patient ambulatory to triage with steady gait, without difficulty or distress noted; pt reports blurring of right eye upon awakening at 945pm; st enroute also had episode of double vision and had to pull over; denies any known injury; st "feels like a film over top of my eye"; denies any pain; visual acuity left eye 20/30 and right eye 20/40 with corrective glasses

## 2015-02-12 ENCOUNTER — Telehealth: Payer: Self-pay | Admitting: Family Medicine

## 2015-02-12 ENCOUNTER — Other Ambulatory Visit: Payer: Self-pay | Admitting: Family Medicine

## 2015-02-12 DIAGNOSIS — H538 Other visual disturbances: Secondary | ICD-10-CM

## 2015-02-12 MED ORDER — TETRACAINE HCL 0.5 % OP SOLN: 1.0000 [drp] | Freq: Once | OPHTHALMIC | Status: DC

## 2015-02-12 MED ORDER — FLUORESCEIN SODIUM 1 MG OP STRP
ORAL_STRIP | OPHTHALMIC | Status: AC
  Filled 2015-02-12: qty 1

## 2015-02-12 MED ORDER — FLUORESCEIN SODIUM 1 MG OP STRP: 1.0000 | ORAL_STRIP | Freq: Once | OPHTHALMIC | Status: DC

## 2015-02-12 MED ORDER — TETRACAINE HCL 0.5 % OP SOLN
OPHTHALMIC | Status: DC
Start: 2015-02-12 — End: 2015-02-12
  Filled 2015-02-12: qty 2

## 2015-02-12 NOTE — ED Notes (Signed)
MD at bedside. 

## 2015-02-12 NOTE — Discharge Instructions (Signed)
Blurred Vision  Having blurred vision means that you cannot see things clearly. Your vision may seem fuzzy or out of focus. Blurred vision is a very common symptom of an eye or vision problem. Blurred vision is often a gradual blur that occurs in one eye or both eyes. There are many causes of blurred vision, including cataracts, macular degeneration, and diabetic retinopathy.  Blurred vision can be diagnosed based on your symptoms and a physical exam. Tell your health care provider about any other health problems you have, any recent eye injury, and any prior surgeries. You may need to see a health care provider who specializes in eye problems (ophthalmologist). Your treatment depends on what is causing your blurred vision.   HOME CARE INSTRUCTIONS   Tell your health care provider about any changes in your blurred vision.   Do not drive or operate heavy machinery if your vision is blurry.   Keep all follow-up visits as directed by your health care provider. This is important.  SEEK MEDICAL CARE IF:   Your symptoms get worse.   You have new symptoms.   You have trouble seeing at night.   You have trouble seeing up close or far away.   You have trouble noticing the difference between colors.  SEEK IMMEDIATE MEDICAL CARE IF:   You have severe eye pain.   You have a severe headache.   You have flashing lights in your field of vision.   You have a sudden change in vision.   You have a sudden loss of vision.   You have vision change after an injury.   You notice drainage coming from your eyes.   You notice a rash around your eyes.     This information is not intended to replace advice given to you by your health care provider. Make sure you discuss any questions you have with your health care provider.     Document Released: 04/01/2003 Document Revised: 08/13/2014 Document Reviewed: 02/20/2014  Elsevier Interactive Patient Education 2016 [employer]

## 2015-02-12 NOTE — Telephone Encounter (Signed)
Referral placed.

## 2015-02-12 NOTE — ED Notes (Signed)
Meds pulled for MD Webster, placed in room. MD Zenda AlpersWebster made aware, verbalized no further orders at this time

## 2015-02-12 NOTE — ED Notes (Signed)
MD Webster at bedside 

## 2015-02-12 NOTE — Telephone Encounter (Signed)
Pt was seen at the ER over night last night for blurred vision in her right eye. They need for her to get a referral to go see Dr Sherlyn LeesVin-Parikh at [facility]. She has an appt scheduled there today for 2:20 pm. She has [employer].

## 2015-02-12 NOTE — ED Provider Notes (Signed)
[facility] Emergency Department Provider Note  ____________________________________________  Time seen: Approximately 2345 PM  I have reviewed the triage vital signs and the nursing notes.   HISTORY  Chief Complaint Eye Problem    HPI Paula Yang is a 49 y.o. female who comes to the hospital with right eye blurred vision. The patient reports that she feels as though she can't see out of it. The patient reports that she laid down at about 6 PM and woke up at 9 PM. She reports that when she woke up her right eye seemed blurry. The patient reports that she's had some blurry vision in the same eye in the past but it usually resolves on its own. The patient denies any pain in her eye. She reports that she tried eyedrops and it didn't help. She also reports that she rubbed her eye thinking she could clear up but it was unable to clear. The patient reports that she feels like there is a film over her eye. The patient has an eye doctor who she sees for her glasses. She reports that she still has vision in that eye is just blurry. In triage the patient was found to have 20/30 vision in her left eye and 20/40 vision in her right eye with her glasses on.   Past Medical History  Diagnosis Date  . Chronic lumbosacral pain   . Headache, variant migraine   . HTN, goal below 140/90   . Gastroesophageal reflux disease without esophagitis   . Genital herpes simplex type 2   . Mild intermittent asthma in adult without complication     Patient Active Problem List   Diagnosis Date Noted  . Abdominal pain 08/05/2014  . Allergic rhinitis 08/05/2014  . Routine general medical examination at a health care facility 08/05/2014  . Gastro-esophageal reflux disease without esophagitis 08/05/2014  . Genital herpes simplex type 2 08/05/2014  . Hangnail 08/05/2014  . Headache, variant migraine 08/05/2014  . Hypertension goal BP (blood pressure) < 140/90 08/05/2014  . Asthma, mild  intermittent 08/05/2014  . Viral sinusitis 08/05/2014  . Back pain, chronic 11/30/2006    Past Surgical History  Procedure Laterality Date  . Cesarean section  1991    Current Outpatient Rx  Name  Route  Sig  Dispense  Refill  . acyclovir (ZOVIRAX) 400 MG tablet      TAKE ONE TABLET BY MOUTH TWICE DAILY   60 tablet   5   . albuterol (PROVENTIL HFA;VENTOLIN HFA) 108 (90 BASE) MCG/ACT inhaler   Inhalation   Inhale into the lungs.         . fluticasone (FLONASE) 50 MCG/ACT nasal spray   Nasal   Place into the nose.         Marland Kitchen HYDROcodone-acetaminophen (NORCO/VICODIN) 5-325 MG per tablet   Oral   Take 1 tablet by mouth 2 (two) times daily as needed for severe pain.   60 tablet   0     Refill 09/27/14   . HYDROcodone-acetaminophen (NORCO/VICODIN) 5-325 MG per tablet   Oral   Take 1 tablet by mouth 2 (two) times daily as needed for severe pain.   60 tablet   0     Refill 10/27/14   . lisinopril-hydrochlorothiazide (PRINZIDE,ZESTORETIC) 20-25 MG per tablet   Oral   Take 1 tablet by mouth daily.   90 tablet   1   . omeprazole (PRILOSEC) 40 MG capsule   Oral   Take by mouth.         Marland Kitchen  predniSONE (STERAPRED UNI-PAK 21 TAB) 10 MG (21) TBPK tablet      Use as directed in a 6 day taper PredPak Patient not taking: Reported on 02/11/2015   21 tablet   0     Allergies Review of patient's allergies indicates no known allergies.  Family History  Problem Relation Age of Onset  . Heart disease Brother   . Heart disease Maternal Aunt   . Diabetes Mother   . Diabetes Maternal Aunt   . Diabetes Maternal Uncle   . Diabetes Maternal Grandmother   . Breast cancer Maternal Grandmother   . Breast cancer Maternal Aunt     Social History Social History  Substance Use Topics  . Smoking status: Never Smoker   . Smokeless tobacco: None  . Alcohol Use: No    Review of Systems Constitutional: No fever/chills Eyes: Right eye blurred vision ENT: No sore  throat. Cardiovascular: Denies chest pain. Respiratory: Denies shortness of breath. Gastrointestinal: No abdominal pain.  No nausea, no vomiting.  No diarrhea.  No constipation. Genitourinary: Negative for dysuria. Musculoskeletal: Negative for back pain. Skin: Negative for rash. Neurological: Negative for headaches, focal weakness or numbness. 10-point ROS otherwise negative.  ____________________________________________   PHYSICAL EXAM:  VITAL SIGNS: ED Triage Vitals  Enc Vitals Group     BP 02/11/15 2313 131/49 mmHg     Pulse Rate 02/11/15 2313 68     Resp 02/11/15 2313 18     Temp 02/11/15 2313 98 F (36.7 C)     Temp Source 02/11/15 2313 Oral     SpO2 02/11/15 2313 99 %     Weight 02/11/15 2313 225 lb (102.059 kg)     Height 02/11/15 2313 5\' 7"  (1.702 m)     Head Cir --      Peak Flow --      Pain Score --      Pain Loc --      Pain Edu? --      Excl. in GC? --     Constitutional: Alert and oriented. Well appearing and in no acute distress. Eyes: Conjunctivae are normal. PERRL. EOMI. no uptake on fluorescein stain. Intraocular pressures measured at 14 and 15 in the right eye. Some blood vessel congestion noted on nondilated funduscopic exam with no visualized papilledema Head: Atraumatic. Nose: No congestion/rhinnorhea. Mouth/Throat: Mucous membranes are moist.  Oropharynx non-erythematous. Cardiovascular: Normal rate, regular rhythm. Grossly normal heart sounds.  Good peripheral circulation. Respiratory: Normal respiratory effort.  No retractions. Lungs CTAB. Gastrointestinal: Soft and nontender. No distention.  Musculoskeletal: No lower extremity tenderness nor edema.   Neurologic:  Normal speech and language. No gross focal neurologic deficits are appreciated. Skin:  Skin is warm, dry and intact.  Psychiatric: Mood and affect are normal.   ____________________________________________   LABS (all labs ordered are listed, but only abnormal results are  displayed)  Labs Reviewed - No data to display ____________________________________________  EKG  None ____________________________________________  RADIOLOGY  None ____________________________________________   PROCEDURES  Procedure(s) performed: None  Critical Care performed: No  ____________________________________________   INITIAL IMPRESSION / ASSESSMENT AND PLAN / ED COURSE  Pertinent labs & imaging results that were available during my care of the patient were reviewed by me and considered in my medical decision making (see chart for details).  The patient is a 49 year old female who comes in today with some right eye blurred vision. On funduscopic exam does appear as though the patient may have some blood vessel congestion. This  makes me think of a possible diagnosis of central retinal vein occlusion. The patient still has good visual acuity with her glasses. The patient does not have increased intraocular pressures. I contacted ophthalmology Dr. Sherlyn Lees and she recommended having the patient come into the office at 8:30 in the morning to have her eye evaluated. Since the patient is not having any acute angle closure glaucoma or any complete visual loss I think that is an appropriate follow-up. I explained to the patient to follow-up in the morning at 8:30 so she could have her eye further evaluated. ____________________________________________   FINAL CLINICAL IMPRESSION(S) / ED DIAGNOSES  Final diagnoses:  Blurred vision, right eye      Rebecka Apley, MD 02/12/15 [phone]

## 2015-03-17 ENCOUNTER — Other Ambulatory Visit: Payer: Self-pay | Admitting: Family Medicine

## 2015-03-20 ENCOUNTER — Encounter (HOSPITAL_COMMUNITY): Payer: Self-pay | Admitting: Emergency Medicine

## 2015-03-20 ENCOUNTER — Emergency Department (HOSPITAL_COMMUNITY)
Admission: EM | Admit: 2015-03-20 | Discharge: 2015-03-21 | Disposition: A | Discharge status: Home / Self Care | Payer: 59 | Attending: Emergency Medicine | Admitting: Emergency Medicine

## 2015-03-20 DIAGNOSIS — I10 Essential (primary) hypertension: Secondary | ICD-10-CM | POA: Insufficient documentation

## 2015-03-20 DIAGNOSIS — Z8619 Personal history of other infectious and parasitic diseases: Secondary | ICD-10-CM | POA: Diagnosis not present

## 2015-03-20 DIAGNOSIS — Z79899 Other long term (current) drug therapy: Secondary | ICD-10-CM | POA: Diagnosis not present

## 2015-03-20 DIAGNOSIS — K219 Gastro-esophageal reflux disease without esophagitis: Secondary | ICD-10-CM | POA: Diagnosis not present

## 2015-03-20 DIAGNOSIS — R008 Other abnormalities of heart beat: Secondary | ICD-10-CM | POA: Insufficient documentation

## 2015-03-20 DIAGNOSIS — G43809 Other migraine, not intractable, without status migrainosus: Secondary | ICD-10-CM | POA: Diagnosis not present

## 2015-03-20 DIAGNOSIS — J4520 Mild intermittent asthma, uncomplicated: Secondary | ICD-10-CM | POA: Insufficient documentation

## 2015-03-20 DIAGNOSIS — G8929 Other chronic pain: Secondary | ICD-10-CM | POA: Insufficient documentation

## 2015-03-20 DIAGNOSIS — R0602 Shortness of breath: Secondary | ICD-10-CM | POA: Diagnosis not present

## 2015-03-20 LAB — I-STAT CHEM 8, ED
BUN: 17 mg/dL (ref 6–20)
CALCIUM ION: 1.12 mmol/L (ref 1.12–1.23)
CHLORIDE: 104 mmol/L (ref 101–111)
Creatinine, Ser: 0.6 mg/dL (ref 0.44–1.00)
Glucose, Bld: 150 mg/dL — ABNORMAL HIGH (ref 65–99)
HCT: 43 % (ref 36.0–46.0)
HEMOGLOBIN: 14.6 g/dL (ref 12.0–15.0)
Potassium: 3.3 mmol/L — ABNORMAL LOW (ref 3.5–5.1)
SODIUM: 140 mmol/L (ref 135–145)
TCO2: 23 mmol/L (ref 0–100)

## 2015-03-20 LAB — I-STAT TROPONIN, ED: TROPONIN I, POC: 0 ng/mL (ref 0.00–0.08)

## 2015-03-20 LAB — I-STAT BETA HCG BLOOD, ED (MC, WL, AP ONLY): I-stat hCG, quantitative: <5 m[IU]/mL (ref <5)

## 2015-03-20 MED ORDER — SODIUM CHLORIDE 0.9 % IV BOLUS (SEPSIS)
1000.0000 mL | Freq: Once | INTRAVENOUS | Status: AC
Start: 2015-03-20 — End: 2015-03-21
  Administered 2015-03-20: 1000 mL via INTRAVENOUS

## 2015-03-20 NOTE — ED Provider Notes (Signed)
CSN: [id number]     Arrival date & time 03/20/15  2231 History  By signing my name below, I, Bethel Born, attest that this documentation has been prepared under the direction and in the presence of Tomasita Crumble, MD. Electronically Signed: Bethel Born, ED Scribe. 03/20/2015. 11:26 PM     Chief Complaint  Patient presents with  . Shortness of Breath   The history is provided by the patient. No language interpreter was used.   Paula Yang is a 49 y.o. female with history of asthma and HTN who presents to the Emergency Department complaining of SOB with onset 3-4 days ago. Her breathing worsened tonight and her home inhalers provided insufficient relief. Pt states that this feels different than asthma. Associated symptoms include sweating, cough ("to clear my throat tonight"), intermittent "pinching" chest pain that is exacerbated by exertion but not deep breathing, and worsening LE swelling (L>R) for 1 month. Pt denies nausea and vomiting. She [place] hours [place] the beach on [place]15.  No recent surgery or hospitalization, bone fracture, or hormonal therapy. She uses lisinopril-hydrochlorothiazide but denies other diuretic.    Past Medical History  Diagnosis Date  . Chronic lumbosacral pain   . Headache, variant migraine   . HTN, goal below 140/90   . Gastroesophageal reflux disease without esophagitis   . Genital herpes simplex type 2   . Mild intermittent asthma in adult without complication    Past Surgical History  Procedure Laterality Date  . Cesarean section  1991   Family History  Problem Relation Age of Onset  . Heart disease Brother   . Heart disease Maternal Aunt   . Diabetes Mother   . Diabetes Maternal Aunt   . Diabetes Maternal Uncle   . Diabetes Maternal Grandmother   . Breast cancer Maternal Grandmother   . Breast cancer Maternal Aunt    Social History  Substance Use Topics  . Smoking status: Never Smoker   . Smokeless tobacco: None  . Alcohol Use:  No   OB History    Gravida Para Term Preterm AB TAB SAB Ectopic Multiple Living   5 3             Review of Systems 10 Systems reviewed and all are negative for acute change except as noted in the HPI.  Allergies  Review of patient's allergies indicates no known allergies.  Home Medications   Prior to Admission medications   Medication Sig Start Date End Date Taking? Authorizing Provider  acyclovir (ZOVIRAX) 400 MG tablet TAKE ONE TABLET BY MOUTH TWICE DAILY 01/14/15   Edwena Felty, MD  albuterol (PROVENTIL HFA;VENTOLIN HFA) 108 (90 BASE) MCG/ACT inhaler Inhale into the lungs. 06/19/13   Historical Provider, MD  fluticasone (FLONASE) 50 MCG/ACT nasal spray Place into the nose. 05/09/14   Historical Provider, MD  HYDROcodone-acetaminophen (NORCO/VICODIN) 5-325 MG per tablet Take 1 tablet by mouth 2 (two) times daily as needed for severe pain. 09/27/14   Edwena Felty, MD  HYDROcodone-acetaminophen (NORCO/VICODIN) 5-325 MG per tablet Take 1 tablet by mouth 2 (two) times daily as needed for severe pain. 09/27/14   Edwena Felty, MD  lisinopril-hydrochlorothiazide (PRINZIDE,ZESTORETIC) 20-25 MG per tablet Take 1 tablet by mouth daily. 09/27/14   Edwena Felty, MD  omeprazole (PRILOSEC) 40 MG capsule TAKE ONE CAPSULE BY MOUTH TWICE DAILY 03/17/15   Edwena Felty, MD  predniSONE (STERAPRED UNI-PAK 21 TAB) 10 MG (21) TBPK tablet Use as directed in a 6 day taper PredPak Patient not taking:  Reported on 02/11/2015 09/27/14   Edwena FeltyAshany Sundaram, MD   BP 136/65 mmHg  Pulse 80  Temp(Src) 98.4 F (36.9 C) (Oral)  Resp 22  SpO2 100% Physical Exam  Constitutional: She is oriented to person, place, and time. She appears well-developed and well-nourished. No distress.  HENT:  Head: Normocephalic and atraumatic.  Nose: Nose normal.  Mouth/Throat: Oropharynx is clear and moist. No oropharyngeal exudate.  Eyes: Conjunctivae and EOM are normal. Pupils are equal, round, and reactive to light. No  scleral icterus.  Neck: Normal range of motion. Neck supple. No JVD present. No tracheal deviation present. No thyromegaly present.  Cardiovascular: Normal rate and normal heart sounds.  A regularly irregular rhythm present. Exam reveals no gallop and no friction rub.   No murmur heard. Pulmonary/Chest: Effort normal and breath sounds normal. No respiratory distress. She has no wheezes. She exhibits no tenderness.  Abdominal: Soft. Bowel sounds are normal. She exhibits no distension and no mass. There is no tenderness. There is no rebound and no guarding.  Musculoskeletal: Normal range of motion. She exhibits tenderness. She exhibits no edema.  Left calf tenderness but no edema.  Lymphadenopathy:    She has no cervical adenopathy.  Neurological: She is alert and oriented to person, place, and time. No cranial nerve deficit. She exhibits normal muscle tone.  Skin: Skin is warm and dry. No rash noted. No erythema. No pallor.  Nursing note and vitals reviewed.   ED Course  Procedures (including critical care time) DIAGNOSTIC STUDIES: Oxygen Saturation is 100% on RA,  normal by my interpretation.    COORDINATION OF CARE: 11:19 PM Discussed treatment plan which includes lab work, CT angio chest, and EKG with pt at bedside and pt agreed to plan.  Labs Review Labs Reviewed  BASIC METABOLIC PANEL - Abnormal; Notable for the following:    Sodium 134 (*)    Glucose, Bld 151 (*)    Calcium 8.8 (*)    Anion gap 3 (*)    All other components within normal limits  I-STAT CHEM 8, ED - Abnormal; Notable for the following:    Potassium 3.3 (*)    Glucose, Bld 150 (*)    All other components within normal limits  CBC WITH DIFFERENTIAL/PLATELET  MAGNESIUM  I-STAT TROPOININ, ED  I-STAT BETA HCG BLOOD, ED ([facility], [facility], [facility] ONLY)  I-STAT TROPOININ, ED    Imaging Review Ct Angio Chest Pe W/cm &/or Wo Cm  03/21/2015  CLINICAL DATA:  49 year old female with left-sided chest pain and shortness of breath.  Concern for pulmonary embolism. EXAM: CT ANGIOGRAPHY CHEST WITH CONTRAST TECHNIQUE: Multidetector CT imaging of the chest was performed using the standard protocol during bolus administration of intravenous contrast. Multiplanar CT image reconstructions and MIPs were obtained to evaluate the vascular anatomy. CONTRAST:  80mL OMNIPAQUE IOHEXOL 350 MG/ML SOLN COMPARISON:  None. FINDINGS: New minimal bibasilar dependent atelectatic changes. The lungs are otherwise clear. No focal consolidation, pleural effusion, or pneumothorax. The central airways are patent. The thoracic aorta appears unremarkable. The there is no CT evidence of pulmonary embolism. There is no hilar or mediastinal adenopathy. No cardiomegaly or pericardial effusion. The visualized esophagus and thyroid gland appear unremarkable. There is no axillary adenopathy. The chest wall soft tissues appear unremarkable. The osseous structures are intact. Review of the MIP images confirms the above findings. IMPRESSION: No CT evidence of pulmonary embolism. Electronically Signed   By: Elgie CollardArash  Radparvar M.D.   On: 03/21/2015 01:06   I have personally  reviewed and evaluated these images and lab results as part of my medical decision-making.   EKG Interpretation   Date/Time:  Thursday March 20 2015 23:01:37 EST Ventricular Rate:  88 PR Interval:  161 QRS Duration: 88 QT Interval:  439 QTC Calculation: 531 R Axis:   25 Text Interpretation:  Sinus rhythm Ventricular bigeminy No significant  change since last tracing Confirmed by Erroll Luna [phone]) on  03/20/2015 11:23:51 PM      MDM   Final diagnoses:  None    Patient presents to the ED for evaluation of chest pain and SOB.  She has no wheezing on exam.  She had a recent long drive to [employer] over thanksgiving holiday and described LLE swelling and pain.  I do not see any evidence of the swelling on exam.  This is possibly a PE.  Will obtain CTA for evaluation.    CTA is  negative for PE.  Patient was observed in the ED though the night and her symptoms have improved. She was educated on frequent PVCs seen and her potassium/magnesium were placed in the ED.  AFter this, the PVCs did decrease.  I obtained a repeat troponin due to the exertional component of her chest pain.  This was negative as well.  Her HEART score is 3 due to history, age, and risk factors.  She was advised to follow up with PCP within 3 days.  She appears well and in NAD.  VS remain within her normal limits and she is safe for DC.   Angiocath insertion Performed by: Tomasita Crumble  Consent: Verbal consent obtained. Risks and benefits: risks, benefits and alternatives were discussed Time out: Immediately prior to procedure a "time out" was called to verify the correct patient, procedure, equipment, support staff and site/side marked as required.  Preparation: Patient was prepped and draped in the usual sterile fashion.  Vein Location: R basilic vein  Ultrasound Guided  Gauge: 20 G  Normal blood return and flush without difficulty Patient tolerance: Patient tolerated the procedure well with no immediate complications.      I personally performed the services described in this documentation, which was scribed in my presence. The recorded information has been reviewed and is accurate.     Tomasita Crumble, MD 03/21/15 [phone]

## 2015-03-20 NOTE — ED Notes (Signed)
Pt stated she was lying in bed when she experienced an acute onset of shortness of breath. Pt with asthma history, used her inhaler, but experienced no relief. Pt called EMS. EMS stated patient with expiratory wheezing upon their arrival. EMS gave 5mg  albuterol/atrovent treatment, and patient stated medication worked for only a short time. Upon arrival patient appears anxious and states she is still having trouble breathing.

## 2015-03-21 ENCOUNTER — Emergency Department (HOSPITAL_COMMUNITY): Payer: 59

## 2015-03-21 ENCOUNTER — Encounter (HOSPITAL_COMMUNITY): Payer: Self-pay

## 2015-03-21 LAB — CBC WITH DIFFERENTIAL/PLATELET
BASOS ABS: 0 10*3/uL (ref 0.0–0.1)
Basophils Relative: 0 %
Eosinophils Absolute: 0.1 10*3/uL (ref 0.0–0.7)
Eosinophils Relative: 1 %
HEMATOCRIT: 38.3 % (ref 36.0–46.0)
Hemoglobin: 12.5 g/dL (ref 12.0–15.0)
LYMPHS ABS: 1.5 10*3/uL (ref 0.7–4.0)
LYMPHS PCT: 26 %
MCH: 26.2 pg (ref 26.0–34.0)
MCHC: 32.6 g/dL (ref 30.0–36.0)
MCV: 80.1 fL (ref 78.0–100.0)
Monocytes Absolute: 0.5 10*3/uL (ref 0.1–1.0)
Monocytes Relative: 9 %
NEUTROS ABS: 3.9 10*3/uL (ref 1.7–7.7)
Neutrophils Relative %: 64 %
Platelets: 223 10*3/uL (ref 150–400)
RBC: 4.78 MIL/uL (ref 3.87–5.11)
RDW: 14.2 % (ref 11.5–15.5)
WBC: 6 10*3/uL (ref 4.0–10.5)

## 2015-03-21 LAB — BASIC METABOLIC PANEL
ANION GAP: 3 — AB (ref 5–15)
BUN: 15 mg/dL (ref 6–20)
CHLORIDE: 108 mmol/L (ref 101–111)
CO2: 23 mmol/L (ref 22–32)
Calcium: 8.8 mg/dL — ABNORMAL LOW (ref 8.9–10.3)
Creatinine, Ser: 0.78 mg/dL (ref 0.44–1.00)
GFR calc Af Amer: >60 mL/min (ref >60)
GFR calc non Af Amer: >60 mL/min (ref >60)
GLUCOSE: 151 mg/dL — AB (ref 65–99)
POTASSIUM: 3.5 mmol/L (ref 3.5–5.1)
Sodium: 134 mmol/L — ABNORMAL LOW (ref 135–145)

## 2015-03-21 LAB — MAGNESIUM: Magnesium: 1.7 mg/dL (ref 1.7–2.4)

## 2015-03-21 LAB — I-STAT TROPONIN, ED: Troponin i, poc: 0 ng/mL (ref 0.00–0.08)

## 2015-03-21 MED ORDER — POTASSIUM CHLORIDE CRYS ER 20 MEQ PO TBCR
20.0000 meq | EXTENDED_RELEASE_TABLET | Freq: Once | ORAL | Status: AC
  Administered 2015-03-21: 20 meq via ORAL
  Filled 2015-03-21: qty 1

## 2015-03-21 MED ORDER — POTASSIUM CHLORIDE CRYS ER 20 MEQ PO TBCR
40.0000 meq | EXTENDED_RELEASE_TABLET | Freq: Once | ORAL | Status: AC
Start: 2015-03-21 — End: 2015-03-21
  Administered 2015-03-21: 40 meq via ORAL
  Filled 2015-03-21: qty 2

## 2015-03-21 MED ORDER — MAGNESIUM SULFATE 2 GM/50ML IV SOLN
2.0000 g | Freq: Once | INTRAVENOUS | Status: AC
  Administered 2015-03-21: 2 g via INTRAVENOUS
  Filled 2015-03-21: qty 50

## 2015-03-21 MED ORDER — IOHEXOL 350 MG/ML SOLN
100.0000 mL | Freq: Once | INTRAVENOUS | Status: AC | PRN
  Administered 2015-03-21: 80 mL via INTRAVENOUS

## 2015-03-21 NOTE — ED Notes (Signed)
Pt left with all her belongings and ambulated out of the treatment area.  

## 2015-03-21 NOTE — ED Notes (Signed)
This RN unable to gain IV access appropriate for CT angio. Mora Bellmanni, MD at bedside with ultrasound.

## 2015-03-21 NOTE — Discharge Instructions (Signed)
Shortness of Breath Paula Yang, your CT scan was normal.  Your blood work shows that your potassium and magnesium are low.  See a primary care doctor within 3 days for close follow up.  If any symptoms worsen, come back to the ED immediately.  Thank you. Shortness of breath means you have trouble breathing. Shortness of breath needs medical care right away. HOME CARE   Do not smoke.  Avoid being around chemicals or things (paint fumes, dust) that may bother your breathing.  Rest as needed. Slowly begin your normal activities.  Only take medicines as told by your doctor.  Keep all doctor visits as told. GET HELP RIGHT AWAY IF:   Your shortness of breath gets worse.  You feel lightheaded, pass out (faint), or have a cough that is not helped by medicine.  You cough up blood.  You have pain with breathing.  You have pain in your chest, arms, shoulders, or belly (abdomen).  You have a fever.  You cannot walk up stairs or exercise the way you normally do.  You do not get better in the time expected.  You have a hard time doing normal activities even with rest.  You have problems with your medicines.  You have any new symptoms. MAKE SURE YOU:  Understand these instructions.  Will watch your condition.  Will get help right away if you are not doing well or get worse.   This information is not intended to replace advice given to you by your health care provider. Make sure you discuss any questions you have with your health care provider.   Document Released: 09/15/2007 Document Revised: 04/03/2013 Document Reviewed: 06/14/2011 Elsevier Interactive Patient Education 2016 Elsevier Inc. Premature Ventricular Contraction A premature ventricular contraction is an irregularity in the normal heart rhythm. These contractions are extra heartbeats that occur too early in the normal sequence. In most cases, these contractions are harmless and do not require  treatment. CAUSES Premature ventricular contractions may occur without a known cause. In healthy people, the extra contractions may be caused by:  Smoking.  Drinking alcohol.  Caffeine.  Certain medicines.  Some illegal drugs.  Stress. Sometimes, changes in chemicals in the blood (electrolytes) can also cause premature ventricular contractions. They can also occur in people with heart diseases that cause a decrease in blood flow to the heart. SIGNS AND SYMPTOMS Premature ventricular contractions often do not cause any symptoms. In some cases, you may have a feeling of your heart beating fast or skipping a beat (palpitations). DIAGNOSIS Your health care provider will take your medical history and do a physical exam. During the exam, the health care provider will check for irregular heartbeats. Various tests may be done to help diagnose premature ventricular contractions. These tests may include:  An ECG (electrocardiogram) to monitor the electrical activity of your heart.  Holter monitor testing. A Holter monitor is a portable device that can monitor the electrical activity of your heart over longer periods of time.  Stress tests to see how exercise affects your heart rhythm.  Echocardiogram. This test uses sound waves (ultrasound) to produce an image of your heart.  Electrophysiology study. This is used to evaluate the electrical conduction system of your heart. TREATMENT Usually, no treatment is needed. You may be advised to avoid things that can trigger the premature contractions, such as caffeine or alcohol. Medicines are sometimes given if symptoms are severe or if the extra heartbeats are very frequent. Treatment may also be needed for  an underlying cause of the contractions if one is found. HOME CARE INSTRUCTIONS  Take medicines only as directed by your health care provider.  Make any lifestyle changes recommended by your health care provider. These may include:  Quitting  smoking.  Avoiding or limiting caffeine or alcohol.  Exercising. Talk to your health care provider about what type of exercise is safe for you.  Trying to reduce stress.  Keep all follow-up visits with your health care provider. This is important. SEEK IMMEDIATE MEDICAL CARE IF:  You feel palpitations that are frequent or continual.  You have chest pain.  You have shortness of breath.  You have sweating for no reason.  You have nausea and vomiting.  You become light-headed or faint.   This information is not intended to replace advice given to you by your health care provider. Make sure you discuss any questions you have with your health care provider.   Document Released: 11/14/2003 Document Revised: 04/19/2014 Document Reviewed: 08/30/2013 Elsevier Interactive Patient Education [employer]

## 2015-03-26 ENCOUNTER — Encounter: Payer: Self-pay | Admitting: Family Medicine

## 2015-03-26 ENCOUNTER — Ambulatory Visit (INDEPENDENT_AMBULATORY_CARE_PROVIDER_SITE_OTHER): Payer: 59 | Admitting: Family Medicine

## 2015-03-26 VITALS — BP 122/74 | HR 65 | Temp 97.8°F | Resp 12 | Wt 229.5 lb

## 2015-03-26 DIAGNOSIS — E876 Hypokalemia: Secondary | ICD-10-CM

## 2015-03-26 DIAGNOSIS — I499 Cardiac arrhythmia, unspecified: Secondary | ICD-10-CM | POA: Diagnosis not present

## 2015-03-26 DIAGNOSIS — I10 Essential (primary) hypertension: Secondary | ICD-10-CM

## 2015-03-26 NOTE — Progress Notes (Signed)
Name: Paula Yang   MRN: [id number]    DOB: [birth year]   Date:03/26/2015       Progress Note  Subjective  Chief Complaint  Chief Complaint  Patient presents with  . Follow-up    patient was recently seen at the ER for SOB and was told her potassium & magnesium was low    HPI  Paula Yang is a 49 y.o. female with history of asthma and HTN who was seen in the ER for SOB on 03/20/15. CTA was negative for PE. Patient was observed in the ED though the night and her symptoms have improved. She was educated on frequent PVCs seen and her potassium/magnesium were placed in the ED. After this, the PVCs did decrease.ACS ruled out.No recent surgery or hospitalization, bone fracture, or hormonal therapy. She uses lisinopril-hydrochlorothiazide for hypertension. Although SOB has decreased still having some muscle tightness around her entire chest wall lower half with sensation of dropped heart beat at times. No chest pain or dizziness or pre-syncope or headaches.   Past Medical History  Diagnosis Date  . Chronic lumbosacral pain   . Headache, variant migraine   . HTN, goal below 140/90   . Gastroesophageal reflux disease without esophagitis   . Genital herpes simplex type 2   . Mild intermittent asthma in adult without complication     Patient Active Problem List   Diagnosis Date Noted  . Abdominal pain 08/05/2014  . Allergic rhinitis 08/05/2014  . Routine general medical examination at a health care facility 08/05/2014  . Gastro-esophageal reflux disease without esophagitis 08/05/2014  . Genital herpes simplex type 2 08/05/2014  . Hangnail 08/05/2014  . Headache, variant migraine 08/05/2014  . Hypertension goal BP (blood pressure) < 140/90 08/05/2014  . Asthma, mild intermittent 08/05/2014  . Viral sinusitis 08/05/2014  . Back pain, chronic 11/30/2006    Social History  Substance Use Topics  . Smoking status: Never Smoker   . Smokeless tobacco: Not on file  . Alcohol  Use: No     Current outpatient prescriptions:  .  albuterol (PROVENTIL HFA;VENTOLIN HFA) 108 (90 BASE) MCG/ACT inhaler, Inhale 1-2 puffs into the lungs every 6 (six) hours as needed for wheezing. , Disp: , Rfl:  .  fluticasone (FLONASE) 50 MCG/ACT nasal spray, Place 1 spray into the nose daily as needed for allergies. , Disp: , Rfl:  .  HYDROcodone-acetaminophen (NORCO/VICODIN) 5-325 MG per tablet, Take 1 tablet by mouth 2 (two) times daily as needed for severe pain., Disp: 60 tablet, Rfl: 0 .  lisinopril-hydrochlorothiazide (PRINZIDE,ZESTORETIC) 20-25 MG per tablet, Take 1 tablet by mouth daily., Disp: 90 tablet, Rfl: 1 .  omeprazole (PRILOSEC) 40 MG capsule, TAKE ONE CAPSULE BY MOUTH TWICE DAILY, Disp: 60 capsule, Rfl: 5 .  Propylene Glycol (SYSTANE BALANCE) 0.6 % SOLN, Place 2 drops into both eyes 2 (two) times daily as needed (dryness)., Disp: , Rfl:   Past Surgical History  Procedure Laterality Date  . Cesarean section  1991    Family History  Problem Relation Age of Onset  . Heart disease Brother   . Heart disease Maternal Aunt   . Diabetes Mother   . Diabetes Maternal Aunt   . Diabetes Maternal Uncle   . Diabetes Maternal Grandmother   . Breast cancer Maternal Grandmother   . Breast cancer Maternal Aunt     No Known Allergies   Review of Systems  CONSTITUTIONAL: No significant weight changes, fever, chills, weakness or fatigue.   CARDIOVASCULAR:  No chest pain, chest pressure or chest discomfort. No palpitations or edema.  RESPIRATORY: No shortness of breath, cough or sputum.  GASTROINTESTINAL: No anorexia, nausea, vomiting. No changes in bowel habits. No abdominal pain or blood.  NEUROLOGICAL: No headache, dizziness, syncope, paralysis, ataxia, numbness or tingling in the extremities. No memory changes. No change in bowel or bladder control.  MUSCULOSKELETAL: No joint pain. Yes muscle pain. PSYCHIATRIC: No change in mood. No change in sleep pattern.     Objective  BP 122/74 mmHg  Pulse 65  Temp(Src) 97.8 F (36.6 C) (Oral)  Resp 12  Wt 229 lb 8 oz (104.101 kg)  SpO2 99%  LMP 03/26/2015 (Exact Date) Body mass index is 35.94 kg/(m^2).  Physical Exam  Constitutional: Patient is obese and well-nourished. In no distress.  Neck: Normal range of motion. Neck supple. No JVD present. Mild diffuse thyromegaly present.  Cardiovascular: Normal rate, irregular rhythm (3rd beat dropped) and normal heart sounds.  No murmur heard.  Pulmonary/Chest: Effort normal and breath sounds normal. No respiratory distress. Musculoskeletal: Normal range of motion bilateral UE and LE, no joint effusions. Peripheral vascular: Bilateral LE no edema. Neurological: CN II-XII grossly intact with no focal deficits. Alert and oriented to person, place, and time. Coordination, balance, strength, speech and gait are normal.  Skin: Skin is warm and dry. No rash noted. No erythema.  Psychiatric: Patient has a normal mood and affect. Behavior is normal in office today. Judgment and thought content normal in office today.   Recent Results (from the past 2160 hour(s))  CBC with Differential/Platelet     Status: None   Collection Time: 03/20/15 11:46 PM  Result Value Ref Range   WBC 6.0 4.0 - 10.5 K/uL   RBC 4.78 3.87 - 5.11 MIL/uL   Hemoglobin 12.5 12.0 - 15.0 g/dL   HCT 38.3 36.0 - 46.0 %   MCV 80.1 78.0 - 100.0 fL   MCH 26.2 26.0 - 34.0 pg   MCHC 32.6 30.0 - 36.0 g/dL   RDW 14.2 11.5 - 15.5 %   Platelets 223 150 - 400 K/uL   Neutrophils Relative % 64 %   Neutro Abs 3.9 1.7 - 7.7 K/uL   Lymphocytes Relative 26 %   Lymphs Abs 1.5 0.7 - 4.0 K/uL   Monocytes Relative 9 %   Monocytes Absolute 0.5 0.1 - 1.0 K/uL   Eosinophils Relative 1 %   Eosinophils Absolute 0.1 0.0 - 0.7 K/uL   Basophils Relative 0 %   Basophils Absolute 0.0 0.0 - 0.1 K/uL  Basic metabolic panel     Status: Abnormal   Collection Time: 03/20/15 11:46 PM  Result Value Ref Range   Sodium  134 (L) 135 - 145 mmol/L   Potassium 3.5 3.5 - 5.1 mmol/L   Chloride 108 101 - 111 mmol/L   CO2 23 22 - 32 mmol/L   Glucose, Bld 151 (H) 65 - 99 mg/dL   BUN 15 6 - 20 mg/dL   Creatinine, Ser 0.78 0.44 - 1.00 mg/dL   Calcium 8.8 (L) 8.9 - 10.3 mg/dL   GFR calc non Af Amer >60 >60 mL/min   GFR calc Af Amer >60 >60 mL/min    Comment: (NOTE) The eGFR has been calculated using the CKD EPI equation. This calculation has not been validated in all clinical situations. eGFR's persistently <60 mL/min signify possible Chronic Kidney Disease.    Anion gap 3 (L) 5 - 15  Magnesium     Status: None  Collection Time: 03/20/15 11:46 PM  Result Value Ref Range   Magnesium 1.7 1.7 - 2.4 mg/dL  I-stat troponin, ED     Status: None   Collection Time: 03/20/15 11:48 PM  Result Value Ref Range   Troponin i, poc 0.00 0.00 - 0.08 ng/mL   Comment 3            Comment: Due to the release kinetics of cTnI, a negative result within the first hours of the onset of symptoms does not rule out myocardial infarction with certainty. If myocardial infarction is still suspected, repeat the test at appropriate intervals.   I-Stat Beta hCG blood, ED ([facility], [facility], [facility] only)     Status: None   Collection Time: 03/20/15 11:49 PM  Result Value Ref Range   I-stat hCG, quantitative <5.0 <5 mIU/mL   Comment 3            Comment:   GEST. AGE      CONC.  (mIU/mL)   <=1 WEEK        5 - 50     2 WEEKS       50 - 500     3 WEEKS       100 - 10,000     4 WEEKS     1,000 - 30,000        FEMALE AND NON-PREGNANT FEMALE:     LESS THAN 5 mIU/mL   I-stat chem 8, ed     Status: Abnormal   Collection Time: 03/20/15 11:50 PM  Result Value Ref Range   Sodium 140 135 - 145 mmol/L   Potassium 3.3 (L) 3.5 - 5.1 mmol/L   Chloride 104 101 - 111 mmol/L   BUN 17 6 - 20 mg/dL   Creatinine, Ser 0.60 0.44 - 1.00 mg/dL   Glucose, Bld 150 (H) 65 - 99 mg/dL   Calcium, Ion 1.12 1.12 - 1.23 mmol/L   TCO2 23 0 - 100 mmol/L   Hemoglobin 14.6  12.0 - 15.0 g/dL   HCT 43.0 36.0 - 46.0 %  I-stat troponin, ED     Status: None   Collection Time: 03/21/15  2:42 AM  Result Value Ref Range   Troponin i, poc 0.00 0.00 - 0.08 ng/mL   Comment 3            Comment: Due to the release kinetics of cTnI, a negative result within the first hours of the onset of symptoms does not rule out myocardial infarction with certainty. If myocardial infarction is still suspected, repeat the test at appropriate intervals.      Assessment & Plan  1. Hypertension goal BP (blood pressure) < 140/90 Well controled.  - Basic Metabolic Panel (BMET) - Magnesium - T3, free - T4, free - TSH - Ambulatory referral to Cardiology  2. Hypokalemia Recheck lab work. Given information on dietary changes to increase K and Mg.  - Basic Metabolic Panel (BMET) - Magnesium - T3, free - T4, free - TSH - Ambulatory referral to Cardiology  3. Hypomagnesemia Recheck lab work.  - Basic Metabolic Panel (BMET) - Magnesium - T3, free - T4, free - TSH - Ambulatory referral to Cardiology  4. Irregular heart beat Cardiology referral for stress testing and Echocardiogram. Will rule out thyroid disorder.   - Basic Metabolic Panel (BMET) - Magnesium - T3, free - T4, free - TSH - Ambulatory referral to Cardiology

## 2015-03-27 ENCOUNTER — Telehealth: Payer: Self-pay | Admitting: Family Medicine

## 2015-03-27 LAB — BASIC METABOLIC PANEL
BUN/Creatinine Ratio: 12 (ref 9–23)
BUN: 9 mg/dL (ref 6–24)
CALCIUM: 9.2 mg/dL (ref 8.7–10.2)
CO2: 26 mmol/L (ref 18–29)
Chloride: 100 mmol/L (ref 96–106)
Creatinine, Ser: 0.74 mg/dL (ref 0.57–1.00)
GFR calc Af Amer: 111 mL/min/{1.73_m2} (ref >59)
GFR, EST NON AFRICAN AMERICAN: 96 mL/min/{1.73_m2} (ref >59)
GLUCOSE: 97 mg/dL (ref 65–99)
POTASSIUM: 4.2 mmol/L (ref 3.5–5.2)
SODIUM: 140 mmol/L (ref 134–144)

## 2015-03-27 LAB — T3, FREE: T3 FREE: 3.2 pg/mL (ref 2.0–4.4)

## 2015-03-27 LAB — TSH: TSH: 0.619 u[IU]/mL (ref 0.450–4.500)

## 2015-03-27 LAB — T4, FREE: Free T4: 0.96 ng/dL (ref 0.82–1.77)

## 2015-03-27 LAB — MAGNESIUM: MAGNESIUM: 2 mg/dL (ref 1.6–2.3)

## 2015-03-27 NOTE — Telephone Encounter (Signed)
Patient has appointment with Dr Mariah MillingGollan 06/10/15 and would like to know if there is another place that she can get in sooner due to her shortness of breath not getting any better. Please return call [phone]

## 2015-04-15 ENCOUNTER — Telehealth: Payer: Self-pay | Admitting: Family Medicine

## 2015-04-15 NOTE — Telephone Encounter (Signed)
Pt notified has not got the flu shot this season.

## 2015-04-15 NOTE — Telephone Encounter (Signed)
Pt would like a call back. She wants to know if she has had her flu shot.

## 2015-04-30 ENCOUNTER — Telehealth: Payer: Self-pay | Admitting: Family Medicine

## 2015-04-30 NOTE — Telephone Encounter (Signed)
Pt wanted you to change her pharmacy in our system. She no longer goes to [facility] due to insurance she now goes to [facility] [address]

## 2015-05-07 ENCOUNTER — Encounter: Payer: Self-pay | Admitting: Family Medicine

## 2015-05-14 ENCOUNTER — Encounter: Payer: Self-pay | Admitting: Urgent Care

## 2015-05-14 ENCOUNTER — Emergency Department
Admission: EM | Admit: 2015-05-14 | Discharge: 2015-05-14 | Disposition: A | Discharge status: Home / Self Care | Payer: BLUE CROSS/BLUE SHIELD | Attending: Emergency Medicine | Admitting: Emergency Medicine

## 2015-05-14 DIAGNOSIS — R51 Headache: Secondary | ICD-10-CM | POA: Diagnosis present

## 2015-05-14 DIAGNOSIS — I10 Essential (primary) hypertension: Secondary | ICD-10-CM | POA: Diagnosis not present

## 2015-05-14 DIAGNOSIS — G43709 Chronic migraine without aura, not intractable, without status migrainosus: Secondary | ICD-10-CM

## 2015-05-14 DIAGNOSIS — Z79899 Other long term (current) drug therapy: Secondary | ICD-10-CM | POA: Diagnosis not present

## 2015-05-14 MED ORDER — KETOROLAC TROMETHAMINE 60 MG/2ML IM SOLN
60.0000 mg | Freq: Once | INTRAMUSCULAR | Status: AC
  Administered 2015-05-14: 60 mg via INTRAMUSCULAR
  Filled 2015-05-14: qty 2

## 2015-05-14 NOTE — Discharge Instructions (Signed)
Recurrent Migraine Headache A migraine headache is an intense, throbbing pain on one or both sides of your head. Recurrent migraines keep coming back. A migraine can last for 30 minutes to several hours. CAUSES  The exact cause of a migraine headache is not always known. However, a migraine may be caused when nerves in the brain become irritated and release chemicals that cause inflammation. This causes pain. Certain things may also trigger migraines, such as:   Alcohol.  Smoking.  Stress.  Menstruation.  Aged cheeses.  Foods or drinks that contain nitrates, glutamate, aspartame, or tyramine.  Lack of sleep.  Chocolate.  Caffeine.  Hunger.  Physical exertion.  Fatigue.  Medicines used to treat chest pain (nitroglycerine), birth control pills, estrogen, and some blood pressure medicines. SYMPTOMS   Pain on one or both sides of your head.  Pulsating or throbbing pain.  Severe pain that prevents daily activities.  Pain that is aggravated by any physical activity.  Nausea, vomiting, or both.  Dizziness.  Pain with exposure to bright lights, loud noises, or activity.  General sensitivity to bright lights, loud noises, or smells. Before you get a migraine, you may get warning signs that a migraine is coming (aura). An aura may include:  Seeing flashing lights.  Seeing bright spots, halos, or zigzag lines.  Having tunnel vision or blurred vision.  Having feelings of numbness or tingling.  Having trouble talking.  Having muscle weakness. DIAGNOSIS  A recurrent migraine headache is often diagnosed based on:  Symptoms.  Physical examination.  A CT scan or MRI of your head. These imaging tests cannot diagnose migraines but can help rule out other causes of headaches.  TREATMENT  Medicines may be given for pain and nausea. Medicines can also be given to help prevent recurrent migraines. HOME CARE INSTRUCTIONS  Only take over-the-counter or prescription  medicines for pain or discomfort as directed by your health care provider. The use of long-term narcotics is not recommended.  Lie down in a dark, quiet room when you have a migraine.  Keep a journal to find out what may trigger your migraine headaches. For example, write down:  What you eat and drink.  How much sleep you get.  Any change to your diet or medicines.  Limit alcohol consumption.  Quit smoking if you smoke.  Get 7-9 hours of sleep, or as recommended by your health care provider.  Limit stress.  Keep lights dim if bright lights bother you and make your migraines worse. SEEK MEDICAL CARE IF:   You do not get relief from the medicines given to you.  You have a recurrence of pain.  You have a fever. SEEK IMMEDIATE MEDICAL CARE IF:  Your migraine becomes severe.  You have a stiff neck.  You have loss of vision.  You have muscular weakness or loss of muscle control.  You start losing your balance or have trouble walking.  You feel faint or pass out.  You have severe symptoms that are different from your first symptoms. MAKE SURE YOU:   Understand these instructions.  Will watch your condition.  Will get help right away if you are not doing well or get worse.   This information is not intended to replace advice given to you by your health care provider. Make sure you discuss any questions you have with your health care provider.   Document Released: 12/22/2000 Document Revised: 04/19/2014 Document Reviewed: 12/04/2012 Elsevier Interactive Patient Education 2016 Elsevier Inc.  

## 2015-05-14 NOTE — ED Notes (Signed)
Patient presents with c/o a non-specific headache since last Wednesday. Denies N/V, neck pain, and vision changes.

## 2015-05-14 NOTE — ED Provider Notes (Signed)
[facility] Emergency Department Provider Note  ____________________________________________  Time seen: Approximately 10:29 PM  I have reviewed the triage vital signs and the nursing notes.   HISTORY  Chief Complaint Headache    HPI Paula Yang is a 50 y.o. female who presents emergency department complaining of a headache. Patient states that she has a known history of migraines and states that this headache has been ongoing for the last 6 days. Patient denies any changes from baseline. She is tried over-the-counter medications with no relief. Patient denies any visual acuity changes, neck pain, chest pain, shortness of breath, nausea or vomiting.   Past Medical History  Diagnosis Date  . Chronic lumbosacral pain   . Headache, variant migraine   . HTN, goal below 140/90   . Gastroesophageal reflux disease without esophagitis   . Genital herpes simplex type 2   . Mild intermittent asthma in adult without complication     Patient Active Problem List   Diagnosis Date Noted  . Hypokalemia 03/26/2015  . Hypomagnesemia 03/26/2015  . Irregular heart beat 03/26/2015  . Abdominal pain 08/05/2014  . Allergic rhinitis 08/05/2014  . Routine general medical examination at a health care facility 08/05/2014  . Gastro-esophageal reflux disease without esophagitis 08/05/2014  . Genital herpes simplex type 2 08/05/2014  . Hangnail 08/05/2014  . Headache, variant migraine 08/05/2014  . Hypertension goal BP (blood pressure) < 140/90 08/05/2014  . Asthma, mild intermittent 08/05/2014  . Viral sinusitis 08/05/2014  . Back pain, chronic 11/30/2006    Past Surgical History  Procedure Laterality Date  . Cesarean section  1991    Current Outpatient Rx  Name  Route  Sig  Dispense  Refill  . albuterol (PROVENTIL HFA;VENTOLIN HFA) 108 (90 BASE) MCG/ACT inhaler   Inhalation   Inhale 1-2 puffs into the lungs every 6 (six) hours as needed for wheezing.           . fluticasone (FLONASE) 50 MCG/ACT nasal spray   Nasal   Place 1 spray into the nose daily as needed for allergies.          Marland Kitchen HYDROcodone-acetaminophen (NORCO/VICODIN) 5-325 MG per tablet   Oral   Take 1 tablet by mouth 2 (two) times daily as needed for severe pain.   60 tablet   0     Refill 09/27/14   . lisinopril-hydrochlorothiazide (PRINZIDE,ZESTORETIC) 20-25 MG per tablet   Oral   Take 1 tablet by mouth daily.   90 tablet   1   . omeprazole (PRILOSEC) 40 MG capsule      TAKE ONE CAPSULE BY MOUTH TWICE DAILY   60 capsule   5   . Propylene Glycol (SYSTANE BALANCE) 0.6 % SOLN   Both Eyes   Place 2 drops into both eyes 2 (two) times daily as needed (dryness).           Allergies Review of patient's allergies indicates no known allergies.  Family History  Problem Relation Age of Onset  . Heart disease Brother   . Heart disease Maternal Aunt   . Diabetes Mother   . Diabetes Maternal Aunt   . Diabetes Maternal Uncle   . Diabetes Maternal Grandmother   . Breast cancer Maternal Grandmother   . Breast cancer Maternal Aunt     Social History Social History  Substance Use Topics  . Smoking status: Never Smoker   . Smokeless tobacco: None  . Alcohol Use: No     Review of  Systems  Constitutional: No fever/chills Eyes: No visual changes. Cardiovascular: no chest pain. Respiratory: no cough. No SOB. Gastrointestinal: No nausea, no vomiting.   Skin: Negative for rash. Neurological: Negative for headaches, focal weakness or numbness. 10-point ROS otherwise negative.  ____________________________________________   PHYSICAL EXAM:  VITAL SIGNS: ED Triage Vitals  Enc Vitals Group     BP 05/14/15 2058 137/64 mmHg     Pulse Rate 05/14/15 2058 74     Resp 05/14/15 2058 18     Temp 05/14/15 2058 98.5 F (36.9 C)     Temp Source 05/14/15 2058 Oral     SpO2 05/14/15 2058 98 %     Weight 05/14/15 2058 229 lb (103.874 kg)     Height 05/14/15 2058   (1.702 m)     Head Cir --      Peak Flow --      Pain Score 05/14/15 2059 6     Pain Loc --      Pain Edu? --      Excl. in GC? --      Constitutional: Alert and oriented. Well appearing and in no acute distress. Eyes: Conjunctivae are normal. PERRL. EOMI. Head: Atraumatic. Neck: No stridor.  Hematological/Lymphatic/Immunilogical: No cervical lymphadenopathy. Cardiovascular: Normal rate, regular rhythm. Normal S1 and S2.  Good peripheral circulation. Respiratory: Normal respiratory effort without tachypnea or retractions. Lungs CTAB. Musculoskeletal: No lower extremity tenderness nor edema.  No joint effusions. Neurologic:  Normal speech and language. No gross focal neurologic deficits are appreciated. Cranial nerves II through XII are grossly intact. Skin:  Skin is warm, dry and intact. No rash noted. Psychiatric: Mood and affect are normal. Speech and behavior are normal. Patient exhibits appropriate insight and judgement.   ____________________________________________   LABS (all labs ordered are listed, but only abnormal results are displayed)  Labs Reviewed - No data to display ____________________________________________  EKG   ____________________________________________  RADIOLOGY   No results found.  ____________________________________________    PROCEDURES  Procedure(s) performed:       Medications  ketorolac (TORADOL) injection 60 mg (not administered)     ____________________________________________   INITIAL IMPRESSION / ASSESSMENT AND PLAN / ED COURSE  Pertinent labs & imaging results that were available during my care of the patient were reviewed by me and considered in my medical decision making (see chart for details).  Patient's diagnosis is consistent with migraine headache.. Patient is given injections of Toradol here in the emergency department. Patient is to follow up with primary care provider if symptoms persist past this  treatment course. Patient is given ED precautions to return to the ED for any worsening or new symptoms.     ____________________________________________  FINAL CLINICAL IMPRESSION(S) / ED DIAGNOSES  Final diagnoses:  Chronic migraine without aura without status migrainosus, not intractable      NEW MEDICATIONS STARTED DURING THIS VISIT:  New Prescriptions   No medications on file        Racheal Patches, PA-C 05/14/15 2236  Phineas Semen, MD 05/15/15 [phone]

## 2015-05-21 ENCOUNTER — Ambulatory Visit (INDEPENDENT_AMBULATORY_CARE_PROVIDER_SITE_OTHER): Payer: BLUE CROSS/BLUE SHIELD | Admitting: Cardiovascular Disease

## 2015-05-21 ENCOUNTER — Encounter: Payer: Self-pay | Admitting: Cardiovascular Disease

## 2015-05-21 VITALS — BP 130/80 | HR 67 | Ht 67.0 in | Wt 232.8 lb

## 2015-05-21 DIAGNOSIS — R0602 Shortness of breath: Secondary | ICD-10-CM | POA: Diagnosis not present

## 2015-05-21 DIAGNOSIS — E876 Hypokalemia: Secondary | ICD-10-CM

## 2015-05-21 DIAGNOSIS — J4520 Mild intermittent asthma, uncomplicated: Secondary | ICD-10-CM

## 2015-05-21 DIAGNOSIS — I493 Ventricular premature depolarization: Secondary | ICD-10-CM

## 2015-05-21 DIAGNOSIS — R079 Chest pain, unspecified: Secondary | ICD-10-CM

## 2015-05-21 DIAGNOSIS — I10 Essential (primary) hypertension: Secondary | ICD-10-CM

## 2015-05-21 MED ORDER — FUROSEMIDE 20 MG PO TABS: 20.0000 mg | ORAL_TABLET | Freq: Every day | ORAL | Status: DC | PRN

## 2015-05-21 MED ORDER — POTASSIUM CHLORIDE ER 10 MEQ PO TBCR: 10.0000 meq | EXTENDED_RELEASE_TABLET | Freq: Every day | ORAL | Status: DC | PRN

## 2015-05-21 NOTE — Assessment & Plan Note (Signed)
We have encouraged continued exercise, careful diet management in an effort to lose weight. 

## 2015-05-21 NOTE — Assessment & Plan Note (Addendum)
Etiology of her shortness of breath is unclear Significant sleep disorder, lots of snoring, weight gain, high fluid intake with salt intake, eats lots of pretzels. All of the above may be contributing to mild chronic diastolic CHF. Recommended she take Lasix with potassium as needed for shortness of breath Echocardiogram to evaluate right heart pressures  CT scan reviewed with her in detail showing no underlying PAD or coronary disease. No stress test needed Certainly possible that weight gain and deconditioning is contributing to her symptoms   Total encounter time more than 45 minutes  Greater than 50% was spent in counseling and coordination of care with the patient

## 2015-05-21 NOTE — Progress Notes (Signed)
Patient ID: Paula Yang, female    DOB: 1965-05-08, 50 y.o.   MRN: [id number]  HPI Comments: Paula Yang is a pleasant 50 year old woman, referred for consultation by Dr. Sherley Bounds, for symptoms of shortness of breath. She has history of asthma, recent hypokalemia, long history of PVCs, palpitations  She reports that she's had worsening shortness of breath over the past several months Symptoms became severe 03/20/2015, she called EMS and was taken to the hospital for further evaluation CT scan of the chest reviewed with the patient including images This showed no PE, no PAD or coronary artery disease, cardiac size looked regular  Shortness of breath is worse when supine, also when she is on her side She does report a history of snoring, does not know she has sleep apnea. Boyfriend reports that she has significant snoring problem  Weight has been trending up over the past year, possibly 5-10 pounds She does drink significant water, Her biggest snack is pretzels  Frequently will have leg swelling and hand swelling, denies having ankle swelling  Reports she is used to her extra beats, reports that she can feel it up to 10 times per day  Was given extra potassium recently in the emergency room, currently not on potassium daily  EKG on today's visit shows normal sinus rhythm with rate 67 bpm, PVCs in a trigeminal pattern   No Known Allergies  Current Outpatient Prescriptions on File Prior to Visit  Medication Sig Dispense Refill  . albuterol (PROVENTIL HFA;VENTOLIN HFA) 108 (90 BASE) MCG/ACT inhaler Inhale 1-2 puffs into the lungs every 6 (six) hours as needed for wheezing.     . fluticasone (FLONASE) 50 MCG/ACT nasal spray Place 1 spray into the nose daily as needed for allergies.     Marland Kitchen HYDROcodone-acetaminophen (NORCO/VICODIN) 5-325 MG per tablet Take 1 tablet by mouth 2 (two) times daily as needed for severe pain. 60 tablet 0  . lisinopril-hydrochlorothiazide (PRINZIDE,ZESTORETIC)  20-25 MG per tablet Take 1 tablet by mouth daily. 90 tablet 1  . omeprazole (PRILOSEC) 40 MG capsule TAKE ONE CAPSULE BY MOUTH TWICE DAILY 60 capsule 5  . Propylene Glycol (SYSTANE BALANCE) 0.6 % SOLN Place 2 drops into both eyes 2 (two) times daily as needed (dryness).     No current facility-administered medications on file prior to visit.    Past Medical History  Diagnosis Date  . Chronic lumbosacral pain   . Headache, variant migraine   . HTN, goal below 140/90   . Gastroesophageal reflux disease without esophagitis   . Genital herpes simplex type 2   . Mild intermittent asthma in adult without complication     Past Surgical History  Procedure Laterality Date  . Cesarean section  46    Social History  reports that she has never smoked. She does not have any smokeless tobacco history on file. She reports that she does not drink alcohol or use illicit drugs.  Family History family history includes Breast cancer in her maternal aunt and maternal grandmother; Diabetes in her maternal aunt, maternal grandmother, maternal uncle, and mother; Heart disease in her brother and maternal aunt.      Review of Systems  Constitutional: Negative.   HENT: Negative.   Eyes: Negative.   Respiratory: Positive for shortness of breath.   Cardiovascular: Positive for leg swelling.  Gastrointestinal: Negative.   Endocrine: Negative.   Musculoskeletal: Negative.   Skin: Negative.   Allergic/Immunologic: Negative.   Neurological: Negative.   Hematological: Negative.  Psychiatric/Behavioral: Negative.   All other systems reviewed and are negative.   BP 130/80 mmHg  Pulse 67  Ht  (1.702 m)  Wt 232 lb 12 oz (105.575 kg)  BMI 36.45 kg/m2  LMP 04/20/2015  Physical Exam  Constitutional: She is oriented to person, place, and time. She appears well-developed and well-nourished.  Obese  HENT:  Head: Normocephalic.  Nose: Nose normal.  Mouth/Throat: Oropharynx is clear and  moist.  Eyes: Conjunctivae are normal. Pupils are equal, round, and reactive to light.  Neck: Normal range of motion. Neck supple. No JVD present.  Cardiovascular: Normal rate, regular rhythm, normal heart sounds and intact distal pulses.  Exam reveals no gallop and no friction rub.   No murmur heard. Pulmonary/Chest: Effort normal and breath sounds normal. No respiratory distress. She has no wheezes. She has no rales. She exhibits no tenderness.  Abdominal: Soft. Bowel sounds are normal. She exhibits no distension. There is no tenderness.  Musculoskeletal: Normal range of motion. She exhibits no edema or tenderness.  Lymphadenopathy:    She has no cervical adenopathy.  Neurological: She is alert and oriented to person, place, and time. Coordination normal.  Skin: Skin is warm and dry. No rash noted. No erythema.  Psychiatric: She has a normal mood and affect. Her behavior is normal. Judgment and thought content normal.

## 2015-05-21 NOTE — Assessment & Plan Note (Signed)
She does not feel that her shortness of breath secondary to asthma. Reports this is well controlled

## 2015-05-21 NOTE — Assessment & Plan Note (Signed)
Blood pressure is well controlled on today's visit. No changes made to the medications. We will add potassium 10 mEq 2-3 days per week for her HCTZ

## 2015-05-21 NOTE — Assessment & Plan Note (Signed)
We have added potassium to 3 days per week with her HCTZ

## 2015-05-21 NOTE — Assessment & Plan Note (Signed)
She reports that she is symptomatic from her PVCs  PVCs noted on EKG today, did not seem to appreciate them Likely will feel them based on her by position or time of day, often will appreciate the more nighttime Echocardiogram ordered to rule out structural heart disease

## 2015-05-21 NOTE — Patient Instructions (Addendum)
Try lasix/furosemide as needed with potassium for shortness of breath  Please start potassium 3 days per week for low potassium with HCTZ  We will order a echocardiogram for shortness of breath  Please call us if you have new issues that need to be addressed before your next appt.  Your physician wants you to follow-up in: 2 months.  You will receive a reminder letter in the mail two months in advance. If you don't receive a letter, please call our office to schedule the follow-up appointment.

## 2015-05-23 ENCOUNTER — Encounter: Payer: Self-pay | Admitting: Family Medicine

## 2015-05-30 ENCOUNTER — Other Ambulatory Visit: Payer: Self-pay

## 2015-05-30 ENCOUNTER — Ambulatory Visit (INDEPENDENT_AMBULATORY_CARE_PROVIDER_SITE_OTHER): Payer: BLUE CROSS/BLUE SHIELD

## 2015-05-30 DIAGNOSIS — R0602 Shortness of breath: Secondary | ICD-10-CM

## 2015-05-30 DIAGNOSIS — R079 Chest pain, unspecified: Secondary | ICD-10-CM | POA: Diagnosis not present

## 2015-06-03 ENCOUNTER — Encounter: Payer: Self-pay | Admitting: Family Medicine

## 2015-06-03 ENCOUNTER — Ambulatory Visit (INDEPENDENT_AMBULATORY_CARE_PROVIDER_SITE_OTHER): Payer: BLUE CROSS/BLUE SHIELD | Admitting: Family Medicine

## 2015-06-03 ENCOUNTER — Telehealth: Payer: Self-pay

## 2015-06-03 VITALS — BP 124/72 | HR 96 | Temp 98.7°F | Resp 20 | Ht 67.0 in | Wt 230.2 lb

## 2015-06-03 DIAGNOSIS — Z124 Encounter for screening for malignant neoplasm of cervix: Secondary | ICD-10-CM | POA: Insufficient documentation

## 2015-06-03 DIAGNOSIS — E876 Hypokalemia: Secondary | ICD-10-CM | POA: Diagnosis not present

## 2015-06-03 DIAGNOSIS — Z1231 Encounter for screening mammogram for malignant neoplasm of breast: Secondary | ICD-10-CM | POA: Diagnosis not present

## 2015-06-03 DIAGNOSIS — Z13220 Encounter for screening for lipoid disorders: Secondary | ICD-10-CM

## 2015-06-03 DIAGNOSIS — Z136 Encounter for screening for cardiovascular disorders: Secondary | ICD-10-CM | POA: Diagnosis not present

## 2015-06-03 DIAGNOSIS — I10 Essential (primary) hypertension: Secondary | ICD-10-CM

## 2015-06-03 DIAGNOSIS — M549 Dorsalgia, unspecified: Secondary | ICD-10-CM | POA: Diagnosis not present

## 2015-06-03 DIAGNOSIS — G8929 Other chronic pain: Secondary | ICD-10-CM | POA: Diagnosis not present

## 2015-06-03 DIAGNOSIS — IMO0001 Reserved for inherently not codable concepts without codable children: Secondary | ICD-10-CM | POA: Insufficient documentation

## 2015-06-03 DIAGNOSIS — N63 Unspecified lump in breast: Secondary | ICD-10-CM

## 2015-06-03 DIAGNOSIS — M25562 Pain in left knee: Secondary | ICD-10-CM

## 2015-06-03 DIAGNOSIS — Z0000 Encounter for general adult medical examination without abnormal findings: Secondary | ICD-10-CM | POA: Diagnosis not present

## 2015-06-03 DIAGNOSIS — N6320 Unspecified lump in the left breast, unspecified quadrant: Secondary | ICD-10-CM | POA: Insufficient documentation

## 2015-06-03 MED ORDER — HYDROCODONE-ACETAMINOPHEN 5-325 MG PO TABS: 1.0000 | ORAL_TABLET | Freq: Two times a day (BID) | ORAL | Status: DC | PRN

## 2015-06-03 MED ORDER — LISINOPRIL-HYDROCHLOROTHIAZIDE 20-25 MG PO TABS: 1.0000 | ORAL_TABLET | Freq: Every day | ORAL | Status: DC

## 2015-06-03 NOTE — Progress Notes (Signed)
Name: Paula Yang   MRN: [id number]    DOB: 1966-02-21   Date:06/03/2015       Progress Note  Subjective  Chief Complaint  Chief Complaint  Patient presents with  . Annual Exam    HPI  Patient is here today for a Complete Female Physical Exam:  The patient has no unusual complaints. Overall feels healthy. Diet is well balanced. In general does not exercise regularly. Sees dentist regularly and addresses vision concerns with ophthalmologist if applicable. In regards to sexual activity the patient is not currently sexually active. Currently is not concerned about exposure to any STDs.   She has started work up with Cardiologist regarding palpitations, confirmed PVCs, no CAD, pending Echo report. SOB likely due to de conditioning and obesity not cardiogenic thus far.   Hypertension is well controled and is a chronic problem. The problem is unchanged. The problem is controlled. Pertinent negatives include no anxiety, blurred vision, chest pain, headaches, malaise/fatigue, orthopnea. Does have occasional palpitations, peripheral edema and/or shortness of breath. Agents associated with hypertension include NSAIDs. Risk factors for coronary artery disease include dyslipidemia, obesity and sedentary lifestyle. Past treatments include diuretics and ACE inhibitors. Compliance problems include exercise and diet. There is no history of kidney disease, heart failure or a thyroid problem. There is no history of chronic renal disease.   Patient complains of arthralgias for which has been present for several years. Pain is located in lumbar spine, left knee, and is described as aching, and is intermittent . Associated symptoms include: decreased range of motion. The patient has tried opioid, NSAID, exercises. Related to injury: no. 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  Past Medical History  Diagnosis Date  . Chronic lumbosacral pain   . Headache,  variant migraine   . HTN, goal below 140/90   . Gastroesophageal reflux disease without esophagitis   . Genital herpes simplex type 2   . Mild intermittent asthma in adult without complication     Past Surgical History  Procedure Laterality Date  . Cesarean section  1991    Family History  Problem Relation Age of Onset  . Heart disease Brother   . Heart disease Maternal Aunt   . Diabetes Mother   . Diabetes Maternal Aunt   . Diabetes Maternal Uncle   . Diabetes Maternal Grandmother   . Breast cancer Maternal Grandmother   . Breast cancer Maternal Aunt     Social History   Social History  . Marital Status: Single    Spouse Name: N/A  . Number of Children: 4  . Years of Education: N/A   Occupational History  . CNA     [place] History Main Topics  . Smoking status: Never Smoker   . Smokeless tobacco: Not on file  . Alcohol Use: No  . Drug Use: No  . Sexual Activity:    Partners: Male   Other Topics Concern  . Not on file   Social History Narrative     Current outpatient prescriptions:  .  albuterol (PROVENTIL HFA;VENTOLIN HFA) 108 (90 BASE) MCG/ACT inhaler, Inhale 1-2 puffs into the lungs every 6 (six) hours as needed for wheezing. , Disp: , Rfl:  .  fluticasone (FLONASE) 50 MCG/ACT nasal spray, Place 1 spray into the nose daily as needed for allergies. , Disp: , Rfl:  .  furosemide (LASIX) 20 MG tablet, Take 1 tablet (20 mg total) by mouth daily as  needed., Disp: 30 tablet, Rfl: 3 .  HYDROcodone-acetaminophen (NORCO/VICODIN) 5-325 MG per tablet, Take 1 tablet by mouth 2 (two) times daily as needed for severe pain., Disp: 60 tablet, Rfl: 0 .  lisinopril-hydrochlorothiazide (PRINZIDE,ZESTORETIC) 20-25 MG per tablet, Take 1 tablet by mouth daily., Disp: 90 tablet, Rfl: 1 .  omeprazole (PRILOSEC) 40 MG capsule, TAKE ONE CAPSULE BY MOUTH TWICE DAILY, Disp: 60 capsule, Rfl: 5 .  potassium chloride (K-DUR) 10 MEQ tablet, Take 1 tablet (10 mEq total)  by mouth daily as needed., Disp: 30 tablet, Rfl: 6 .  Propylene Glycol (SYSTANE BALANCE) 0.6 % SOLN, Place 2 drops into both eyes 2 (two) times daily as needed (dryness)., Disp: , Rfl:   No Known Allergies  ROS  CONSTITUTIONAL: No significant weight changes, fever, chills, weakness or fatigue.  HEENT:  - Eyes: No visual changes.  - Ears: No auditory changes. No pain.  - Nose: No sneezing, congestion, runny nose. - Throat: No sore throat. No changes in swallowing. SKIN: No rash or itching.  CARDIOVASCULAR: No chest pain, chest pressure or chest discomfort. Occasional palpitations or edema.  RESPIRATORY: No shortness of breath, cough or sputum.  GASTROINTESTINAL: No anorexia, nausea, vomiting. No changes in bowel habits. No abdominal pain or blood.  GENITOURINARY: No dysuria. No frequency. No discharge.  NEUROLOGICAL: No headache, dizziness, syncope, paralysis, ataxia, numbness or tingling in the extremities. No memory changes. No change in bowel or bladder control.  MUSCULOSKELETAL: No joint pain. No muscle pain. HEMATOLOGIC: No anemia, bleeding or bruising.  LYMPHATICS: No enlarged lymph nodes.  PSYCHIATRIC: No change in mood. No change in sleep pattern.  ENDOCRINOLOGIC: No reports of sweating, cold or heat intolerance. No polyuria or polydipsia.   Objective  Filed Vitals:   06/03/15 1443  BP: 124/72  Pulse: 96  Temp: 98.7 F (37.1 C)  Resp: 20  Height: '5\' 7"'  (1.702 m)  Weight: 230 lb 3 oz (104.412 kg)  SpO2: 98%   Body mass index is 36.04 kg/(m^2).  Depression screen [facility] 2/9 03/26/2015 09/27/2014  Decreased Interest 0 0  Down, Depressed, Hopeless 0 0  PHQ - 2 Score 0 0       Recent Results (from the past 2160 hour(s))  CBC with Differential/Platelet     Status: None   Collection Time: 03/20/15 11:46 PM  Result Value Ref Range   WBC 6.0 4.0 - 10.5 K/uL   RBC 4.78 3.87 - 5.11 MIL/uL   Hemoglobin 12.5 12.0 - 15.0 g/dL   HCT 38.3 36.0 - 46.0 %   MCV 80.1 78.0 -  100.0 fL   MCH 26.2 26.0 - 34.0 pg   MCHC 32.6 30.0 - 36.0 g/dL   RDW 14.2 11.5 - 15.5 %   Platelets 223 150 - 400 K/uL   Neutrophils Relative % 64 %   Neutro Abs 3.9 1.7 - 7.7 K/uL   Lymphocytes Relative 26 %   Lymphs Abs 1.5 0.7 - 4.0 K/uL   Monocytes Relative 9 %   Monocytes Absolute 0.5 0.1 - 1.0 K/uL   Eosinophils Relative 1 %   Eosinophils Absolute 0.1 0.0 - 0.7 K/uL   Basophils Relative 0 %   Basophils Absolute 0.0 0.0 - 0.1 K/uL  Basic metabolic panel     Status: Abnormal   Collection Time: 03/20/15 11:46 PM  Result Value Ref Range   Sodium 134 (L) 135 - 145 mmol/L   Potassium 3.5 3.5 - 5.1 mmol/L   Chloride 108 101 - 111 mmol/L  CO2 23 22 - 32 mmol/L   Glucose, Bld 151 (H) 65 - 99 mg/dL   BUN 15 6 - 20 mg/dL   Creatinine, Ser 0.78 0.44 - 1.00 mg/dL   Calcium 8.8 (L) 8.9 - 10.3 mg/dL   GFR calc non Af Amer >60 >60 mL/min   GFR calc Af Amer >60 >60 mL/min    Comment: (NOTE) The eGFR has been calculated using the CKD EPI equation. This calculation has not been validated in all clinical situations. eGFR's persistently <60 mL/min signify possible Chronic Kidney Disease.    Anion gap 3 (L) 5 - 15  Magnesium     Status: None   Collection Time: 03/20/15 11:46 PM  Result Value Ref Range   Magnesium 1.7 1.7 - 2.4 mg/dL  I-stat troponin, ED     Status: None   Collection Time: 03/20/15 11:48 PM  Result Value Ref Range   Troponin i, poc 0.00 0.00 - 0.08 ng/mL   Comment 3            Comment: Due to the release kinetics of cTnI, a negative result within the first hours of the onset of symptoms does not rule out myocardial infarction with certainty. If myocardial infarction is still suspected, repeat the test at appropriate intervals.   I-Stat Beta hCG blood, ED ([facility], [facility], [facility] only)     Status: None   Collection Time: 03/20/15 11:49 PM  Result Value Ref Range   I-stat hCG, quantitative <5.0 <5 mIU/mL   Comment 3            Comment:   GEST. AGE      CONC.  (mIU/mL)    <=1 WEEK        5 - 50     2 WEEKS       50 - 500     3 WEEKS       100 - 10,000     4 WEEKS     1,000 - 30,000        FEMALE AND NON-PREGNANT FEMALE:     LESS THAN 5 mIU/mL   I-stat chem 8, ed     Status: Abnormal   Collection Time: 03/20/15 11:50 PM  Result Value Ref Range   Sodium 140 135 - 145 mmol/L   Potassium 3.3 (L) 3.5 - 5.1 mmol/L   Chloride 104 101 - 111 mmol/L   BUN 17 6 - 20 mg/dL   Creatinine, Ser 0.60 0.44 - 1.00 mg/dL   Glucose, Bld 150 (H) 65 - 99 mg/dL   Calcium, Ion 1.12 1.12 - 1.23 mmol/L   TCO2 23 0 - 100 mmol/L   Hemoglobin 14.6 12.0 - 15.0 g/dL   HCT 43.0 36.0 - 46.0 %  I-stat troponin, ED     Status: None   Collection Time: 03/21/15  2:42 AM  Result Value Ref Range   Troponin i, poc 0.00 0.00 - 0.08 ng/mL   Comment 3            Comment: Due to the release kinetics of cTnI, a negative result within the first hours of the onset of symptoms does not rule out myocardial infarction with certainty. If myocardial infarction is still suspected, repeat the test at appropriate intervals.   Basic Metabolic Panel (BMET)     Status: None   Collection Time: 03/26/15 11:29 AM  Result Value Ref Range   Glucose 97 65 - 99 mg/dL   BUN 9 6 - 24 mg/dL  Creatinine, Ser 0.74 0.57 - 1.00 mg/dL   GFR calc non Af Amer 96 >59 mL/min/1.73   GFR calc Af Amer 111 >59 mL/min/1.73   BUN/Creatinine Ratio 12 9 - 23   Sodium 140 134 - 144 mmol/L    Comment:               **Please note reference interval change**   Potassium 4.2 3.5 - 5.2 mmol/L   Chloride 100 96 - 106 mmol/L    Comment:               **Please note reference interval change**   CO2 26 18 - 29 mmol/L   Calcium 9.2 8.7 - 10.2 mg/dL  Magnesium     Status: None   Collection Time: 03/26/15 11:29 AM  Result Value Ref Range   Magnesium 2.0 1.6 - 2.3 mg/dL  T3, free     Status: None   Collection Time: 03/26/15 11:29 AM  Result Value Ref Range   T3, Free 3.2 2.0 - 4.4 pg/mL  T4, free     Status: None   Collection  Time: 03/26/15 11:29 AM  Result Value Ref Range   Free T4 0.96 0.82 - 1.77 ng/dL  TSH     Status: None   Collection Time: 03/26/15 11:29 AM  Result Value Ref Range   TSH 0.619 0.450 - 4.500 uIU/mL    Physical Exam  Constitutional: Patient is obese and well-nourished. In no distress.  HEENT:  - Head: Normocephalic and atraumatic.  - Ears: Bilateral TMs gray, no erythema or effusion - Nose: Nasal mucosa moist - Mouth/Throat: Oropharynx is clear and moist. No tonsillar hypertrophy or erythema. No post nasal drainage.  - Eyes: Conjunctivae clear, EOM movements normal. PERRLA. No scleral icterus.  Neck: Normal range of motion. Neck supple. No JVD present. No thyromegaly present.  Cardiovascular: Normal rate, regular rhythm and normal heart sounds.  No murmur heard.  Pulmonary/Chest: Effort normal and breath sounds normal. No respiratory distress. Abdominal: Soft. Bowel sounds are normal, no distension. There is no tenderness. no masses BREAST: Bilateral breast exam reveals no skin changes or nipple discharge however left breast has mass at about 3 o'clock position just outside of the nipple perimeter. FEMALE GENITALIA:  External genitalia normal External urethra normal Vaginal vault normal without discharge or lesions Cervix with cysts, easily bleeds with pap testing Bimanual exam normal without masses RECTAL: no rectal masses or hemorrhoids Musculoskeletal: Normal range of motion bilateral UE and LE, no joint effusions. Peripheral vascular: Bilateral LE no edema. Neurological: CN II-XII grossly intact with no focal deficits. Alert and oriented to person, place, and time. Coordination, balance, strength, speech and gait are normal.  Skin: Skin is warm and dry. No rash noted. No erythema.  Psychiatric: Patient has a normal mood and affect. Behavior is normal in office today. Judgment and thought content normal in office today.   Assessment & Plan   1. Annual physical exam Discussed  in detail all recommended preventative measures appropriate for age and gender now and in the future.  Needs Tdap, not in stock at office today unfortunately.   2. Encounter for cholesteral screening for cardiovascular disease  - Lipid panel  3. Hypokalemia Uses lasix prn edema and uses potassium 10 mEq only if she used lasix that day.  - CBC with Differential/Platelet - Comprehensive metabolic panel  4. Encounter for screening mammogram for malignant neoplasm of breast   5. Encounter for screening for malignant neoplasm of cervix  -  Pap IG w/ reflex to HPV when ASC-U  6. Breast mass, left  - MM Digital Diagnostic Bilat; Future - US BREAST LTD UNI LEFT INC AXILLA; Future - US BREAST LTD UNI RIGHT INC AXILLA; Future  7. Back pain, chronic Intermittent use of Norco. Refilled for as needed use. No drug seeking behavior.   - HYDROcodone-acetaminophen (NORCO/VICODIN) 5-325 MG tablet; Take 1 tablet by mouth 2 (two) times daily as needed for severe pain.  Dispense: 40 tablet; Refill: 0  8. Hypertension goal BP (blood pressure) < 140/90 Well controled.  - lisinopril-hydrochlorothiazide (PRINZIDE,ZESTORETIC) 20-25 MG tablet; Take 1 tablet by mouth daily.  Dispense: 90 tablet; Refill: 1  9. Left knee pain Weight loss recommended again.  - HYDROcodone-acetaminophen (NORCO/VICODIN) 5-325 MG tablet; Take 1 tablet by mouth 2 (two) times daily as needed for severe pain.  Dispense: 40 tablet; Refill: 0

## 2015-06-03 NOTE — Telephone Encounter (Signed)
Pt would like echo results,. Please call.

## 2015-06-04 NOTE — Telephone Encounter (Signed)
Patient was calling for echo results. Informed patient that preliminary results showed that the Echo was normal and that the pumping action of her heart was within normal limits. She stated that she could not understand what was causing her shortness of breath. Let her know that sometimes increased weight and decreased activity can cause some of those symptoms. She stated that she was not having any chest pain with the episodes of SOB. Instructed her that if she should start experiencing any chest pain, jaw pain, or left arm pain that she needs to go to the emergency room. Confirmed her appointment that she has 07/31/2015 with Dr. Mariah Milling. She denied any other questions at this time and told her to call us back if symptoms should worsen. She verbalized understanding of all questions.

## 2015-06-04 NOTE — Telephone Encounter (Signed)
Pt is returning call from Bernette Mayers, RN from yesterday

## 2015-06-06 LAB — PAP IG W/ RFLX HPV ASCU: PAP SMEAR COMMENT: 0

## 2015-06-06 NOTE — Telephone Encounter (Signed)
Patient wanted echo results and did not understand what was causing her to be SOB. She denied any CP with the SOB. Told her signs to look for with CP/SOB and no other questions. She has an upcoming appointment 07/31/2015.

## 2015-06-08 ENCOUNTER — Other Ambulatory Visit: Payer: Self-pay | Admitting: Family Medicine

## 2015-06-08 DIAGNOSIS — B373 Candidiasis of vulva and vagina: Secondary | ICD-10-CM

## 2015-06-08 DIAGNOSIS — R0982 Postnasal drip: Secondary | ICD-10-CM

## 2015-06-08 DIAGNOSIS — B3731 Acute candidiasis of vulva and vagina: Secondary | ICD-10-CM

## 2015-06-08 DIAGNOSIS — J309 Allergic rhinitis, unspecified: Secondary | ICD-10-CM

## 2015-06-08 MED ORDER — FLUCONAZOLE 150 MG PO TABS: 150.0000 mg | ORAL_TABLET | Freq: Once | ORAL | Status: DC

## 2015-06-08 MED ORDER — FLUTICASONE PROPIONATE 50 MCG/ACT NA SUSP: 2.0000 | Freq: Every day | NASAL | Status: DC | PRN

## 2015-06-16 ENCOUNTER — Other Ambulatory Visit: Payer: BLUE CROSS/BLUE SHIELD

## 2015-06-16 ENCOUNTER — Ambulatory Visit: Payer: BLUE CROSS/BLUE SHIELD | Attending: Family Medicine

## 2015-06-19 ENCOUNTER — Telehealth: Payer: Self-pay | Admitting: Family Medicine

## 2015-06-19 NOTE — Telephone Encounter (Signed)
Patient states that the Hydrocodone 5-325 is making her sick to her stomach. She is requesting that you put her back on the hydrocodone that was prescribed before this one or a different back pain medication.

## 2015-06-20 ENCOUNTER — Other Ambulatory Visit: Payer: Self-pay

## 2015-06-20 NOTE — Telephone Encounter (Signed)
The prescription I printed out for her is the same as what I have always printed out for her. She may need to check with her pharmacy if they have been dispensing the same type of pain medication as they did for her in the past.

## 2015-06-20 NOTE — Telephone Encounter (Signed)
It is the same rx, I told her maybe the maufactour has changed to contact the pharmacy.

## 2015-07-10 ENCOUNTER — Ambulatory Visit: Admission: RE | Admit: 2015-07-10 | Payer: BLUE CROSS/BLUE SHIELD | Source: Ambulatory Visit

## 2015-07-10 ENCOUNTER — Other Ambulatory Visit: Payer: BLUE CROSS/BLUE SHIELD

## 2015-07-21 ENCOUNTER — Telehealth: Payer: Self-pay | Admitting: Cardiovascular Disease

## 2015-07-21 ENCOUNTER — Other Ambulatory Visit: Payer: Self-pay | Admitting: *Deleted

## 2015-07-21 ENCOUNTER — Ambulatory Visit: Payer: BLUE CROSS/BLUE SHIELD | Admitting: Cardiovascular Disease

## 2015-07-21 MED ORDER — FUROSEMIDE 20 MG PO TABS: 20.0000 mg | ORAL_TABLET | Freq: Every day | ORAL | Status: DC | PRN

## 2015-07-21 MED ORDER — POTASSIUM CHLORIDE ER 10 MEQ PO TBCR: 10.0000 meq | EXTENDED_RELEASE_TABLET | Freq: Every day | ORAL | Status: DC | PRN

## 2015-07-21 NOTE — Telephone Encounter (Signed)
°*  STAT* If patient is at the pharmacy, call can be transferred to refill team.   1. Which medications need to be refilled? (please list name of each medication and dose if known) lasix 20 mg po daily prn ;   kdur 10 meq po daily as needed   2. Which pharmacy/location (including street and city if local pharmacy) is medication to be sent to? [facility] [address] [place]   3. Do they need a 30 day or 90 day supply? 30

## 2015-07-22 ENCOUNTER — Ambulatory Visit: Payer: BLUE CROSS/BLUE SHIELD | Admitting: Cardiovascular Disease

## 2015-08-04 ENCOUNTER — Telehealth: Payer: Self-pay | Admitting: Cardiovascular Disease

## 2015-08-04 NOTE — Telephone Encounter (Signed)
Left message for pt to call back  °

## 2015-08-04 NOTE — Telephone Encounter (Signed)
Pt c/o Shortness Of Breath: STAT if SOB developed within the last 24 hours or pt is noticeably SOB on the phone  1. Are you currently SOB (can you hear that pt is SOB on the phone)? No   Started again yesterday had 2 episodes   2. How long have you been experiencing SOB?   Started yesterday   3. Are you SOB when sitting or when up moving around? Both episode happened  Laying down after shower   4. Are you currently experiencing any other symptoms? No   Scheduled patient for 08/05/15 opening in afternoon a 3:20 pm with Dr. Mariah MillingGollan

## 2015-08-05 ENCOUNTER — Ambulatory Visit: Payer: BLUE CROSS/BLUE SHIELD | Admitting: Cardiovascular Disease

## 2015-08-06 ENCOUNTER — Ambulatory Visit: Payer: BLUE CROSS/BLUE SHIELD | Admitting: Cardiovascular Disease

## 2015-08-06 ENCOUNTER — Encounter: Payer: Self-pay | Admitting: *Deleted

## 2015-08-12 ENCOUNTER — Other Ambulatory Visit: Payer: Self-pay | Admitting: Family Medicine

## 2015-08-12 DIAGNOSIS — M549 Dorsalgia, unspecified: Principal | ICD-10-CM

## 2015-08-12 DIAGNOSIS — G8929 Other chronic pain: Secondary | ICD-10-CM

## 2015-08-12 DIAGNOSIS — M25562 Pain in left knee: Secondary | ICD-10-CM

## 2015-08-12 NOTE — Telephone Encounter (Signed)
Requesting refill on Hydrocodone patient is completely out.

## 2015-08-12 NOTE — Telephone Encounter (Signed)
Rx denied She would need an appt to discuss if this is appropriate

## 2015-08-15 ENCOUNTER — Ambulatory Visit: Payer: BLUE CROSS/BLUE SHIELD | Admitting: Cardiovascular Disease

## 2015-08-25 NOTE — Telephone Encounter (Signed)
L/M for pt to call the office to schedule an appt

## 2015-09-01 ENCOUNTER — Ambulatory Visit: Payer: BLUE CROSS/BLUE SHIELD | Admitting: Cardiovascular Disease

## 2015-09-04 ENCOUNTER — Encounter: Payer: Self-pay | Admitting: Family Medicine

## 2015-09-04 ENCOUNTER — Ambulatory Visit (INDEPENDENT_AMBULATORY_CARE_PROVIDER_SITE_OTHER): Payer: BLUE CROSS/BLUE SHIELD | Admitting: Family Medicine

## 2015-09-04 VITALS — BP 122/82 | HR 83 | Temp 98.3°F | Resp 16 | Wt 224.0 lb

## 2015-09-04 DIAGNOSIS — E669 Obesity, unspecified: Secondary | ICD-10-CM

## 2015-09-04 DIAGNOSIS — M549 Dorsalgia, unspecified: Secondary | ICD-10-CM | POA: Diagnosis not present

## 2015-09-04 DIAGNOSIS — R0602 Shortness of breath: Secondary | ICD-10-CM

## 2015-09-04 DIAGNOSIS — J4520 Mild intermittent asthma, uncomplicated: Secondary | ICD-10-CM

## 2015-09-04 DIAGNOSIS — L609 Nail disorder, unspecified: Secondary | ICD-10-CM | POA: Diagnosis not present

## 2015-09-04 DIAGNOSIS — G8929 Other chronic pain: Secondary | ICD-10-CM

## 2015-09-04 DIAGNOSIS — L608 Other nail disorders: Secondary | ICD-10-CM

## 2015-09-04 DIAGNOSIS — I10 Essential (primary) hypertension: Secondary | ICD-10-CM

## 2015-09-04 DIAGNOSIS — E876 Hypokalemia: Secondary | ICD-10-CM | POA: Diagnosis not present

## 2015-09-04 MED ORDER — ALBUTEROL SULFATE HFA 108 (90 BASE) MCG/ACT IN AERS: 1.0000 | INHALATION_SPRAY | RESPIRATORY_TRACT | Status: DC | PRN
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
## 2015-09-04 NOTE — Patient Instructions (Addendum)
We'll refer you to physical therapy and the back doctor Try turmeric as a natural anti-inflammatory (for pain and arthritis). It comes in capsules where you buy aspirin and fish oil, but also as a spice where you buy pepper and garlic powder. If you need something for aches or pains, try to use Tylenol (acetaminophen), but always follow package directions and don't take more than 3,000 mg per 24 hours Try the DASH guidelines  DASH Eating Plan DASH stands for "Dietary Approaches to Stop Hypertension." The DASH eating plan is a healthy eating plan that has been shown to reduce high blood pressure (hypertension). Additional health benefits may include reducing the risk of type 2 diabetes mellitus, heart disease, and stroke. The DASH eating plan may also help with weight loss. WHAT DO I NEED TO KNOW ABOUT THE DASH EATING PLAN? For the DASH eating plan, you will follow these general guidelines: 1. Choose foods with a percent daily value for sodium of less than 5% (as listed on the food label). 2. Use salt-free seasonings or herbs instead of table salt or sea salt. 3. Check with your health care provider or pharmacist before using salt substitutes. 4. Eat lower-sodium products, often labeled as "lower sodium" or "no salt added." 5. Eat fresh foods. 6. Eat more vegetables, fruits, and low-fat dairy products. 7. Choose whole grains. Look for the word "whole" as the first word in the ingredient list. 8. Choose fish and skinless chicken or [place] more often than red meat. Limit fish, poultry, and meat to 6 oz (170 g) each day. 9. Limit sweets, desserts, sugars, and sugary drinks. 10. Choose heart-healthy fats. 11. Limit cheese to 1 oz (28 g) per day. 12. Eat more home-cooked food and less restaurant, buffet, and fast food. 13. Limit fried foods. 14. Cook foods using methods other than frying. 15. Limit canned vegetables. If you do use them, rinse them well to decrease the sodium. 16. When eating at a  restaurant, ask that your food be prepared with less salt, or no salt if possible. WHAT FOODS CAN I EAT? Seek help from a dietitian for individual calorie needs. Grains Whole grain or whole wheat bread. Brown rice. Whole grain or whole wheat pasta. Quinoa, bulgur, and whole grain cereals. Low-sodium cereals. Corn or whole wheat flour tortillas. Whole grain cornbread. Whole grain crackers. Low-sodium crackers. Vegetables Fresh or frozen vegetables (raw, steamed, roasted, or grilled). Low-sodium or reduced-sodium tomato and vegetable juices. Low-sodium or reduced-sodium tomato sauce and paste. Low-sodium or reduced-sodium canned vegetables.  Fruits All fresh, canned (in natural juice), or frozen fruits. Meat and Other Protein Products Ground beef (85% or leaner), grass-fed beef, or beef trimmed of fat. Skinless chicken or [place]. Ground chicken or [place]. Pork trimmed of fat. All fish and seafood. Eggs. Dried beans, peas, or lentils. Unsalted nuts and seeds. Unsalted canned beans. Dairy Low-fat dairy products, such as skim or 1% milk, 2% or reduced-fat cheeses, low-fat ricotta or cottage cheese, or plain low-fat yogurt. Low-sodium or reduced-sodium cheeses. Fats and Oils Tub margarines without trans fats. Light or reduced-fat mayonnaise and salad dressings (reduced sodium). Avocado. Safflower, olive, or canola oils. Natural peanut or almond butter. Other Unsalted popcorn and pretzels. The items listed above may not be a complete list of recommended foods or beverages. Contact your dietitian for more options. WHAT FOODS ARE NOT RECOMMENDED? Grains White bread. White pasta. White rice. Refined cornbread. Bagels and croissants. Crackers that contain trans fat. Vegetables Creamed or fried vegetables. Vegetables in a  cheese sauce. Regular canned vegetables. Regular canned tomato sauce and paste. Regular tomato and vegetable juices. Fruits Dried fruits. Canned fruit in light or heavy syrup. Fruit  juice. Meat and Other Protein Products Fatty cuts of meat. Ribs, chicken wings, bacon, sausage, bologna, salami, chitterlings, fatback, hot dogs, bratwurst, and packaged luncheon meats. Salted nuts and seeds. Canned beans with salt. Dairy Whole or 2% milk, cream, half-and-half, and cream cheese. Whole-fat or sweetened yogurt. Full-fat cheeses or blue cheese. Nondairy creamers and whipped toppings. Processed cheese, cheese spreads, or cheese curds. Condiments Onion and garlic salt, seasoned salt, table salt, and sea salt. Canned and packaged gravies. Worcestershire sauce. Tartar sauce. Barbecue sauce. Teriyaki sauce. Soy sauce, including reduced sodium. Steak sauce. Fish sauce. Oyster sauce. Cocktail sauce. Horseradish. Ketchup and mustard. Meat flavorings and tenderizers. Bouillon cubes. Hot sauce. Tabasco sauce. Marinades. Taco seasonings. Relishes. Fats and Oils Butter, stick margarine, lard, shortening, ghee, and bacon fat. Coconut, palm kernel, or palm oils. Regular salad dressings. Other Pickles and olives. Salted popcorn and pretzels. The items listed above may not be a complete list of foods and beverages to avoid. Contact your dietitian for more information. WHERE CAN I FIND MORE INFORMATION? National Heart, Lung, and Blood Institute: [employer]   This information is not intended to replace advice given to you by your health care provider. Make sure you discuss any questions you have with your health care provider.   Document Released: 03/18/2011 Document Revised: 04/19/2014 Document Reviewed: 01/31/2013 Elsevier Interactive Patient Education 2016 Elsevier Inc. Back Exercises The following exercises strengthen the muscles that help to support the back. They also help to keep the lower back flexible. Doing these exercises can help to prevent back pain or lessen existing pain. If you have back pain or discomfort, try doing these exercises 2-3 times  each day or as told by your health care provider. When the pain goes away, do them once each day, but increase the number of times that you repeat the steps for each exercise (do more repetitions). If you do not have back pain or discomfort, do these exercises once each day or as told by your health care provider. EXERCISES Single Knee to Chest Repeat these steps 3-5 times for each leg: 17. Lie on your back on a firm bed or the floor with your legs extended. 18. Bring one knee to your chest. Your other leg should stay extended and in contact with the floor. 19. Hold your knee in place by grabbing your knee or thigh. 20. Pull on your knee until you feel a gentle stretch in your lower back. 21. Hold the stretch for 10-30 seconds. 22. Slowly release and straighten your leg. Pelvic Tilt Repeat these steps 5-10 times: 1. Lie on your back on a firm bed or the floor with your legs extended. 2. Bend your knees so they are pointing toward the ceiling and your feet are flat on the floor. 3. Tighten your lower abdominal muscles to press your lower back against the floor. This motion will tilt your pelvis so your tailbone points up toward the ceiling instead of pointing to your feet or the floor. 4. With gentle tension and even breathing, hold this position for 5-10 seconds. Cat-Cow Repeat these steps until your lower back becomes more flexible: 1. Get into a hands-and-knees position on a firm surface. Keep your hands under your shoulders, and keep your knees under your hips. You may place padding under your knees for comfort. 2. Let your head hang  down, and point your tailbone toward the floor so your lower back becomes rounded like the back of a cat. 3. Hold this position for 5 seconds. 4. Slowly lift your head and point your tailbone up toward the ceiling so your back forms a sagging arch like the back of a cow. 5. Hold this position for 5 seconds. Press-Ups Repeat these steps 5-10 times: 1. Lie on  your abdomen (face-down) on the floor. 2. Place your palms near your head, about shoulder-width apart. 3. While you keep your back as relaxed as possible and keep your hips on the floor, slowly straighten your arms to raise the top half of your body and lift your shoulders. Do not use your back muscles to raise your upper torso. You may adjust the placement of your hands to make yourself more comfortable. 4. Hold this position for 5 seconds while you keep your back relaxed. 5. Slowly return to lying flat on the floor. Bridges Repeat these steps 10 times: 1. Lie on your back on a firm surface. 2. Bend your knees so they are pointing toward the ceiling and your feet are flat on the floor. 3. Tighten your buttocks muscles and lift your buttocks off of the floor until your waist is at almost the same height as your knees. You should feel the muscles working in your buttocks and the back of your thighs. If you do not feel these muscles, slide your feet 1-2 inches farther away from your buttocks. 4. Hold this position for 3-5 seconds. 5. Slowly lower your hips to the starting position, and allow your buttocks muscles to relax completely. If this exercise is too easy, try doing it with your arms crossed over your chest. Abdominal Crunches Repeat these steps 5-10 times: 1. Lie on your back on a firm bed or the floor with your legs extended. 2. Bend your knees so they are pointing toward the ceiling and your feet are flat on the floor. 3. Cross your arms over your chest. 4. Tip your chin slightly toward your chest without bending your neck. 5. Tighten your abdominal muscles and slowly raise your trunk (torso) high enough to lift your shoulder blades a tiny bit off of the floor. Avoid raising your torso higher than that, because it can put too much stress on your low back and it does not help to strengthen your abdominal muscles. 6. Slowly return to your starting position. Back Lifts Repeat these steps  5-10 times: 1. Lie on your abdomen (face-down) with your arms at your sides, and rest your forehead on the floor. 2. Tighten the muscles in your legs and your buttocks. 3. Slowly lift your chest off of the floor while you keep your hips pressed to the floor. Keep the back of your head in line with the curve in your back. Your eyes should be looking at the floor. 4. Hold this position for 3-5 seconds. 5. Slowly return to your starting position. SEEK MEDICAL CARE IF:  Your back pain or discomfort gets much worse when you do an exercise.  Your back pain or discomfort does not lessen within 2 hours after you exercise. If you have any of these problems, stop doing these exercises right away. Do not do them again unless your health care provider says that you can. SEEK IMMEDIATE MEDICAL CARE IF:  You develop sudden, severe back pain. If this happens, stop doing the exercises right away. Do not do them again unless your health care provider says  that you can.   This information is not intended to replace advice given to you by your health care provider. Make sure you discuss any questions you have with your health care provider.   Document Released: 05/06/2004 Document Revised: 12/18/2014 Document Reviewed: 05/23/2014 Elsevier Interactive Patient Education [employer]2016 Elsevier Inc.

## 2015-09-04 NOTE — Assessment & Plan Note (Addendum)
Last K+ was normal; I personally discouraged her from furosemide use unless absolutely needed; talk with Dr. Mariah MillingGollan about this

## 2015-09-04 NOTE — Assessment & Plan Note (Signed)
Keep up the good work with weight loss efforts

## 2015-09-04 NOTE — Assessment & Plan Note (Signed)
Refer to physical therapy; she declined seeing pain clinic, Dr. Metta Clinesrisp; explained that I do not recommend narcotics for chronic back pain; encouraged use of turmeric, tylenol per package direction; naproxen declined

## 2015-09-04 NOTE — Assessment & Plan Note (Signed)
Controlled on ACE-I; reviewed BMP; DASH guidelines recommended, see AVS

## 2015-09-04 NOTE — Assessment & Plan Note (Signed)
Mild intermittent asthma; refill rescue inhalers; avoid triggers

## 2015-09-04 NOTE — Assessment & Plan Note (Signed)
Sees Dr. Mariah MillingGollan for that; she denies having heart failure

## 2015-09-09 ENCOUNTER — Ambulatory Visit: Payer: BLUE CROSS/BLUE SHIELD | Admitting: Cardiovascular Disease

## 2015-09-26 ENCOUNTER — Ambulatory Visit
Admission: RE | Admit: 2015-09-26 | Discharge: 2015-09-26 | Disposition: A | Discharge status: Home / Self Care | Payer: BLUE CROSS/BLUE SHIELD | Source: Ambulatory Visit | Attending: Family Medicine | Admitting: Family Medicine

## 2015-09-26 ENCOUNTER — Other Ambulatory Visit: Payer: Self-pay | Admitting: Family Medicine

## 2015-09-26 ENCOUNTER — Telehealth: Payer: Self-pay | Admitting: Family Medicine

## 2015-09-26 DIAGNOSIS — N6320 Unspecified lump in the left breast, unspecified quadrant: Secondary | ICD-10-CM

## 2015-09-26 DIAGNOSIS — N63 Unspecified lump in breast: Secondary | ICD-10-CM | POA: Insufficient documentation

## 2015-09-26 DIAGNOSIS — R928 Other abnormal and inconclusive findings on diagnostic imaging of breast: Secondary | ICD-10-CM

## 2015-09-26 NOTE — Telephone Encounter (Signed)
They states they will call and get pt setup

## 2015-09-26 NOTE — Telephone Encounter (Signed)
This was in the body of the diagnostic imaging report: -------------------- 2. Diagnostic mammography of the right breast with magnification views to demonstrate stability of the probably benign right breast calcifications. -------------------- Please verify with radiologist if they want this NOW or if they meant this to be done in the future (3 months or 6 months, for example) Thank you

## 2015-10-07 ENCOUNTER — Telehealth: Payer: Self-pay

## 2015-10-08 MED ORDER — OMEPRAZOLE 40 MG PO CPDR: 40.0000 mg | DELAYED_RELEASE_CAPSULE | Freq: Every day | ORAL | Status: DC

## 2015-10-08 NOTE — Telephone Encounter (Signed)
Please let the patient know that I'm going to decrease her PPI from twice a day to once a day Prolonged use of PPIs can lead to pneumonia, colitis, anemia, kidney disease, and osteoporosis Avoid triggers (onions, chocolate, peppermint, sodas, coffee, pizza, spaghetti sauce, peppers, etc.) We'll try to wean this further in the coming months

## 2015-10-09 NOTE — Telephone Encounter (Signed)
Please call pt back about change in medication.

## 2015-10-22 ENCOUNTER — Telehealth: Payer: Self-pay | Admitting: Family Medicine

## 2015-10-22 NOTE — Telephone Encounter (Signed)
Please see phone note dated 09/26/15 regarding abnormal mammogram findings Radiology was going to contact patient but I don't see any follow-up images Please follow-up on this Thank you

## 2015-10-23 NOTE — Telephone Encounter (Signed)
Pt states she needs to follow-up in 6 months

## 2015-10-28 ENCOUNTER — Ambulatory Visit: Payer: Self-pay | Admitting: Sports Medicine

## 2015-10-28 ENCOUNTER — Encounter: Payer: Self-pay | Admitting: Sports Medicine

## 2015-11-05 ENCOUNTER — Other Ambulatory Visit: Payer: Self-pay | Admitting: Family Medicine

## 2015-11-05 NOTE — Telephone Encounter (Signed)
Weaned last month from BID to once a day; at next refill, may drop to 20 mg daily PRN

## 2015-11-25 NOTE — Telephone Encounter (Signed)
Please close encounter

## 2015-11-25 NOTE — Telephone Encounter (Signed)
COMPLETE

## 2015-12-05 ENCOUNTER — Ambulatory Visit: Payer: BLUE CROSS/BLUE SHIELD | Admitting: Family Medicine

## 2015-12-05 ENCOUNTER — Telehealth: Payer: Self-pay | Admitting: Family Medicine

## 2015-12-05 DIAGNOSIS — N6320 Unspecified lump in the left breast, unspecified quadrant: Secondary | ICD-10-CM

## 2015-12-05 NOTE — Telephone Encounter (Signed)
Patient had breast imaging orders placed in February; she got them done in June I have not been able to do a breast exam on her, and she cancelled her appointment for today I called to ask pt to please schedule another appt; now I see that she has an appt for about 4 weeks out Since she had a palpable mass found by Dr. Sherley BoundsSundaram, I will make referral to surgeon

## 2015-12-05 NOTE — Assessment & Plan Note (Signed)
Noted by Dr. Sherley BoundsSundaram in Feb; refer to surgeon

## 2015-12-06 ENCOUNTER — Other Ambulatory Visit: Payer: Self-pay | Admitting: Family Medicine

## 2015-12-06 ENCOUNTER — Encounter: Payer: Self-pay | Admitting: Family Medicine

## 2015-12-06 NOTE — Telephone Encounter (Signed)
I am sending decreased PPI Not meant to to be taken indefinitely She may use Zantac 150 mg daily with this Rx if needed, but my hope is that she'll be able to stop the PPI after one to two months

## 2015-12-08 ENCOUNTER — Encounter: Payer: Self-pay | Admitting: *Deleted

## 2015-12-08 NOTE — Telephone Encounter (Signed)
Pt.notified

## 2015-12-10 ENCOUNTER — Telehealth: Payer: Self-pay

## 2015-12-10 NOTE — Telephone Encounter (Signed)
Pt called states she was unaware that she needed to see a surgeon for breast mass please advise

## 2015-12-10 NOTE — Telephone Encounter (Signed)
Please see my phone note from August 25th You can give her that info; thank you

## 2015-12-12 NOTE — Telephone Encounter (Signed)
Left vociemail...

## 2015-12-16 ENCOUNTER — Other Ambulatory Visit: Payer: Self-pay

## 2015-12-16 DIAGNOSIS — I10 Essential (primary) hypertension: Secondary | ICD-10-CM

## 2015-12-16 DIAGNOSIS — R7309 Other abnormal glucose: Secondary | ICD-10-CM | POA: Insufficient documentation

## 2015-12-16 DIAGNOSIS — E669 Obesity, unspecified: Secondary | ICD-10-CM

## 2015-12-16 DIAGNOSIS — Z5181 Encounter for therapeutic drug level monitoring: Secondary | ICD-10-CM | POA: Insufficient documentation

## 2015-12-16 MED ORDER — LISINOPRIL-HYDROCHLOROTHIAZIDE 20-25 MG PO TABS: 1.0000 | ORAL_TABLET | Freq: Every day | ORAL | 0 refills | Status: DC

## 2015-12-16 NOTE — Telephone Encounter (Signed)
Pt.notified

## 2015-12-16 NOTE — Telephone Encounter (Signed)
Patient has outstanding lab orders that are now >6 months old I'll enter new orders since Dr. Sherley BoundsSundaram is gone I'll send in the Rx for the lisinopril/hctz but we really want her to get labs done in the next week; thank you

## 2015-12-16 NOTE — Assessment & Plan Note (Signed)
Check lipids 

## 2015-12-16 NOTE — Assessment & Plan Note (Signed)
Check labs 

## 2015-12-16 NOTE — Assessment & Plan Note (Signed)
Check A1c and glucose 

## 2015-12-17 ENCOUNTER — Ambulatory Visit: Payer: BLUE CROSS/BLUE SHIELD | Admitting: General Surgery

## 2015-12-25 ENCOUNTER — Ambulatory Visit: Payer: BLUE CROSS/BLUE SHIELD | Admitting: General Surgery

## 2015-12-26 ENCOUNTER — Other Ambulatory Visit: Payer: Self-pay | Admitting: Family Medicine

## 2015-12-28 NOTE — Telephone Encounter (Signed)
Too soon; this is being weaned off

## 2015-12-29 ENCOUNTER — Other Ambulatory Visit: Payer: Self-pay

## 2015-12-29 MED ORDER — ACYCLOVIR 400 MG PO TABS: 400.0000 mg | ORAL_TABLET | Freq: Two times a day (BID) | ORAL | 3 refills | Status: DC

## 2016-01-01 ENCOUNTER — Ambulatory Visit: Payer: BLUE CROSS/BLUE SHIELD | Admitting: Family Medicine

## 2016-01-01 ENCOUNTER — Other Ambulatory Visit: Payer: Self-pay | Admitting: Family Medicine

## 2016-01-01 NOTE — Telephone Encounter (Signed)
Too soon

## 2016-01-07 ENCOUNTER — Other Ambulatory Visit: Payer: Self-pay

## 2016-01-07 ENCOUNTER — Encounter: Payer: Self-pay | Admitting: General Surgery

## 2016-01-07 ENCOUNTER — Ambulatory Visit (INDEPENDENT_AMBULATORY_CARE_PROVIDER_SITE_OTHER): Payer: BLUE CROSS/BLUE SHIELD | Admitting: General Surgery

## 2016-01-07 VITALS — BP 130/80 | HR 74 | Resp 12 | Ht 67.0 in | Wt 225.0 lb

## 2016-01-07 DIAGNOSIS — N63 Unspecified lump in unspecified breast: Secondary | ICD-10-CM

## 2016-01-07 DIAGNOSIS — E669 Obesity, unspecified: Secondary | ICD-10-CM

## 2016-01-07 DIAGNOSIS — Z5181 Encounter for therapeutic drug level monitoring: Secondary | ICD-10-CM

## 2016-01-07 DIAGNOSIS — R7309 Other abnormal glucose: Secondary | ICD-10-CM

## 2016-01-07 LAB — COMPLETE METABOLIC PANEL WITH GFR
ALT: 13 U/L (ref 6–29)
AST: 17 U/L (ref 10–35)
Albumin: 3.8 g/dL (ref 3.6–5.1)
Alkaline Phosphatase: 47 U/L (ref 33–115)
BILIRUBIN TOTAL: 0.3 mg/dL (ref 0.2–1.2)
BUN: 21 mg/dL (ref 7–25)
CHLORIDE: 103 mmol/L (ref 98–110)
CO2: 23 mmol/L (ref 20–31)
Calcium: 9.3 mg/dL (ref 8.6–10.2)
Creat: 0.78 mg/dL (ref 0.50–1.10)
GLUCOSE: 108 mg/dL — AB (ref 65–99)
Potassium: 4.6 mmol/L (ref 3.5–5.3)
SODIUM: 138 mmol/L (ref 135–146)
TOTAL PROTEIN: 7.4 g/dL (ref 6.1–8.1)

## 2016-01-07 LAB — CBC WITH DIFFERENTIAL/PLATELET
BASOS ABS: 0 {cells}/uL (ref 0–200)
BASOS PCT: 0 %
EOS PCT: 1 %
Eosinophils Absolute: 48 cells/uL (ref 15–500)
HCT: 39.4 % (ref 35.0–45.0)
Hemoglobin: 13 g/dL (ref 11.7–15.5)
LYMPHS PCT: 24 %
Lymphs Abs: 1152 cells/uL (ref 850–3900)
MCH: 26.5 pg — AB (ref 27.0–33.0)
MCHC: 33 g/dL (ref 32.0–36.0)
MCV: 80.2 fL (ref 80.0–100.0)
MONOS PCT: 8 %
MPV: 10.2 fL (ref 7.5–12.5)
Monocytes Absolute: 384 cells/uL (ref 200–950)
NEUTROS ABS: 3216 {cells}/uL (ref 1500–7800)
Neutrophils Relative %: 67 %
PLATELETS: 241 10*3/uL (ref 140–400)
RBC: 4.91 MIL/uL (ref 3.80–5.10)
RDW: 14.9 % (ref 11.0–15.0)
WBC: 4.8 10*3/uL (ref 3.8–10.8)

## 2016-01-07 LAB — LIPID PANEL
Cholesterol: 187 mg/dL (ref 125–200)
HDL: 42 mg/dL — ABNORMAL LOW (ref >=46)
LDL CALC: 120 mg/dL (ref <130)
Total CHOL/HDL Ratio: 4.5 Ratio (ref <=5.0)
Triglycerides: 125 mg/dL (ref <150)
VLDL: 25 mg/dL (ref <30)

## 2016-01-07 NOTE — Patient Instructions (Signed)
Follow up as needed

## 2016-01-07 NOTE — Progress Notes (Signed)
Patient ID: Paula Yang, female   DOB: 01-24-1966, 50 y.o.   MRN: [id number]  Chief Complaint  Patient presents with  . Breast Problem    mass    HPI Paula Yang is a 50 y.o. female who presents for a breast evaluation after Dr. Sherley Bounds felt a lump at the 3 o'clock position near her left breast nipple in February of 2017. Patient denies feeling any lumps. She had a normal ultrasound of her left breast completed. Her most recent mammogram was done on 09/27/2015 which showed right breast calcifications which she is being followed for with another mammogram in December 2017. Her left breast did not show any abnormalities. Patient does perform regular self breast checks and does get regular mammograms done. Family history of breast cancer.    I have reviewed the history of present illness with the patient.   HPI  Past Medical History:  Diagnosis Date  . Chronic lumbosacral pain   . Gastroesophageal reflux disease without esophagitis   . Genital herpes simplex type 2   . Headache, variant migraine   . HTN, goal below 140/90   . Mild intermittent asthma in adult without complication     Past Surgical History:  Procedure Laterality Date  . CESAREAN SECTION  1991    Family History  Problem Relation Age of Onset  . Heart disease Brother   . Heart disease Maternal Aunt   . Diabetes Mother   . Diabetes Maternal Aunt   . Diabetes Maternal Uncle   . Diabetes Maternal Grandmother   . Breast cancer Maternal Grandmother   . Breast cancer Maternal Aunt   . Colon cancer Paternal Grandfather     Social History Social History  Substance Use Topics  . Smoking status: Never Smoker  . Smokeless tobacco: Never Used  . Alcohol use No    No Known Allergies  Current Outpatient Prescriptions  Medication Sig Dispense Refill  . albuterol (PROVENTIL HFA;VENTOLIN HFA) 108 (90 Base) MCG/ACT inhaler Inhale 1-2 puffs into the lungs every 4 (four) hours as needed for wheezing. 1 Inhaler  0  . fluticasone (FLONASE) 50 MCG/ACT nasal spray Place 2 sprays into both nostrils daily as needed for allergies. 16 g 2  . furosemide (LASIX) 20 MG tablet Take 1 tablet (20 mg total) by mouth daily as needed. 30 tablet 3  . lisinopril-hydrochlorothiazide (PRINZIDE,ZESTORETIC) 20-25 MG tablet Take 1 tablet by mouth daily. 30 tablet 0  . omeprazole (PRILOSEC) 40 MG capsule Take one capsule by mouth every OTHER day; wean; not meant to be taken chronically; may use Zantac 150 mg daily with this 15 capsule 0  . potassium chloride (K-DUR) 10 MEQ tablet Take 1 tablet (10 mEq total) by mouth daily as needed. 30 tablet 6   No current facility-administered medications for this visit.     Review of Systems Review of Systems  Constitutional: Negative.   Respiratory: Negative.   Cardiovascular: Negative.     Blood pressure 130/80, pulse 74, resp. rate 12, height 5\' 7"  (1.702 m), weight 225 lb (102.1 kg), last menstrual period 12/18/2015.  Physical Exam Physical Exam  Constitutional: She is oriented to person, place, and time. She appears well-developed and well-nourished.  Eyes: Conjunctivae are normal. No scleral icterus.  Neck: Neck supple.  Cardiovascular: Normal rate and regular rhythm.   Pulmonary/Chest: Effort normal and breath sounds normal. Right breast exhibits no inverted nipple, no mass, no nipple discharge, no skin change and no tenderness. Left breast exhibits no inverted  nipple, no mass, no nipple discharge, no skin change and no tenderness.  Abdominal: Soft. Bowel sounds are normal. There is no tenderness.  Lymphadenopathy:    She has no cervical adenopathy.    She has no axillary adenopathy.  Neurological: She is alert and oriented to person, place, and time.  Skin: Skin is warm and dry.    Data Reviewed Mammogram and ultrasound reviewed  No findings on the left side. There is some calcifications in the right breast that need a 6 month follow up.  Assessment    Stable  breast exam with no palpable masses.Pt advised    Plan    Follow up as needed     This information has been scribed by Ples SpecterJessica Qualls CMA.  Wilmore Holsomback G 01/07/2016, 10:49 AM

## 2016-01-08 LAB — HEMOGLOBIN A1C
HEMOGLOBIN A1C: 5.8 % — AB (ref <5.7)
MEAN PLASMA GLUCOSE: 120 mg/dL

## 2016-01-13 ENCOUNTER — Ambulatory Visit: Payer: BLUE CROSS/BLUE SHIELD | Admitting: Family Medicine

## 2016-01-15 ENCOUNTER — Other Ambulatory Visit: Payer: Self-pay | Admitting: Family Medicine

## 2016-01-15 DIAGNOSIS — I10 Essential (primary) hypertension: Secondary | ICD-10-CM

## 2016-01-15 NOTE — Telephone Encounter (Signed)
cmp reviewed; rx approved

## 2016-01-30 ENCOUNTER — Other Ambulatory Visit: Payer: Self-pay

## 2016-01-31 MED ORDER — OMEPRAZOLE 40 MG PO CPDR
DELAYED_RELEASE_CAPSULE | ORAL | 0 refills | Status: DC
Start: 2016-01-31 — End: 2016-03-11

## 2016-03-02 ENCOUNTER — Other Ambulatory Visit: Payer: Self-pay | Admitting: Family Medicine

## 2016-03-02 NOTE — Telephone Encounter (Signed)
Please contact patient The acyclovir was discontinued by Ples SpecterJessica Yang at Dr. Luan MooreSankar's office in late September Please ask if any problems, was another drug started, side effects, etc. I'm okay to refill if no problems, but just need to know why it was stopped Thank you

## 2016-03-02 NOTE — Telephone Encounter (Signed)
Left voicemail about calling back

## 2016-03-02 NOTE — Telephone Encounter (Signed)
Patient states does not know why it was discontinued and was not having any problems.  Please prescribe

## 2016-03-02 NOTE — Telephone Encounter (Signed)
Thank you for clearing that up; Rx sent

## 2016-03-11 ENCOUNTER — Other Ambulatory Visit: Payer: Self-pay | Admitting: Family Medicine

## 2016-03-11 NOTE — Telephone Encounter (Signed)
Decreased dose; refill provided

## 2016-05-03 ENCOUNTER — Other Ambulatory Visit: Payer: Self-pay

## 2016-05-03 DIAGNOSIS — I10 Essential (primary) hypertension: Secondary | ICD-10-CM

## 2016-05-03 DIAGNOSIS — R0982 Postnasal drip: Secondary | ICD-10-CM

## 2016-05-03 DIAGNOSIS — J309 Allergic rhinitis, unspecified: Secondary | ICD-10-CM

## 2016-05-03 MED ORDER — LISINOPRIL-HYDROCHLOROTHIAZIDE 20-25 MG PO TABS: 1.0000 | ORAL_TABLET | Freq: Every day | ORAL | 1 refills | Status: DC

## 2016-05-03 MED ORDER — FLUTICASONE PROPIONATE 50 MCG/ACT NA SUSP: 2.0000 | Freq: Every day | NASAL | 1 refills | Status: DC | PRN

## 2016-05-03 NOTE — Telephone Encounter (Signed)
Last Cr and K+ reviewed; Rx approved 

## 2016-05-03 NOTE — Telephone Encounter (Signed)
Patient needs 90 day per ins

## 2016-06-10 ENCOUNTER — Other Ambulatory Visit: Payer: Self-pay | Admitting: Family Medicine

## 2016-08-11 ENCOUNTER — Ambulatory Visit (INDEPENDENT_AMBULATORY_CARE_PROVIDER_SITE_OTHER): Payer: BLUE CROSS/BLUE SHIELD | Admitting: Family Medicine

## 2016-08-11 ENCOUNTER — Encounter: Payer: Self-pay | Admitting: Family Medicine

## 2016-08-11 VITALS — BP 118/74 | HR 90 | Temp 98.4°F | Resp 14 | Ht 67.5 in | Wt 237.6 lb

## 2016-08-11 DIAGNOSIS — R635 Abnormal weight gain: Secondary | ICD-10-CM | POA: Diagnosis not present

## 2016-08-11 DIAGNOSIS — R002 Palpitations: Secondary | ICD-10-CM | POA: Diagnosis not present

## 2016-08-11 DIAGNOSIS — R7309 Other abnormal glucose: Secondary | ICD-10-CM

## 2016-08-11 DIAGNOSIS — Z0000 Encounter for general adult medical examination without abnormal findings: Secondary | ICD-10-CM | POA: Diagnosis not present

## 2016-08-11 DIAGNOSIS — R252 Cramp and spasm: Secondary | ICD-10-CM

## 2016-08-11 DIAGNOSIS — Z1231 Encounter for screening mammogram for malignant neoplasm of breast: Secondary | ICD-10-CM

## 2016-08-11 DIAGNOSIS — Z1239 Encounter for other screening for malignant neoplasm of breast: Secondary | ICD-10-CM

## 2016-08-11 DIAGNOSIS — Z1211 Encounter for screening for malignant neoplasm of colon: Secondary | ICD-10-CM

## 2016-08-11 DIAGNOSIS — L918 Other hypertrophic disorders of the skin: Secondary | ICD-10-CM | POA: Diagnosis not present

## 2016-08-11 DIAGNOSIS — Z114 Encounter for screening for human immunodeficiency virus [HIV]: Secondary | ICD-10-CM | POA: Diagnosis not present

## 2016-08-11 LAB — COMPLETE METABOLIC PANEL WITH GFR
ALBUMIN: 3.8 g/dL (ref 3.6–5.1)
ALK PHOS: 49 U/L (ref 33–130)
ALT: 14 U/L (ref 6–29)
AST: 16 U/L (ref 10–35)
BUN: 13 mg/dL (ref 7–25)
CO2: 21 mmol/L (ref 20–31)
Calcium: 9.7 mg/dL (ref 8.6–10.4)
Chloride: 102 mmol/L (ref 98–110)
Creat: 0.78 mg/dL (ref 0.50–1.05)
GFR, Est African American: >89 mL/min (ref >=60)
GFR, Est Non African American: 89 mL/min (ref >=60)
GLUCOSE: 108 mg/dL — AB (ref 65–99)
POTASSIUM: 4.2 mmol/L (ref 3.5–5.3)
SODIUM: 135 mmol/L (ref 135–146)
Total Bilirubin: 0.3 mg/dL (ref 0.2–1.2)
Total Protein: 7.4 g/dL (ref 6.1–8.1)

## 2016-08-11 LAB — LIPID PANEL
Cholesterol: 183 mg/dL
HDL: 41 mg/dL — ABNORMAL LOW
LDL Cholesterol: 125 mg/dL — ABNORMAL HIGH
Total CHOL/HDL Ratio: 4.5 ratio
Triglycerides: 87 mg/dL
VLDL: 17 mg/dL

## 2016-08-11 LAB — CBC WITH DIFFERENTIAL/PLATELET
Basophils Absolute: 0 cells/uL (ref 0–200)
Basophils Relative: 0 %
EOS PCT: 1 %
Eosinophils Absolute: 50 cells/uL (ref 15–500)
HCT: 37.9 % (ref 35.0–45.0)
HEMOGLOBIN: 12.2 g/dL (ref 11.7–15.5)
LYMPHS ABS: 1650 {cells}/uL (ref 850–3900)
Lymphocytes Relative: 33 %
MCH: 25.6 pg — ABNORMAL LOW (ref 27.0–33.0)
MCHC: 32.2 g/dL (ref 32.0–36.0)
MCV: 79.6 fL — ABNORMAL LOW (ref 80.0–100.0)
MPV: 10 fL (ref 7.5–12.5)
Monocytes Absolute: 350 cells/uL (ref 200–950)
Monocytes Relative: 7 %
NEUTROS ABS: 2950 {cells}/uL (ref 1500–7800)
Neutrophils Relative %: 59 %
Platelets: 243 10*3/uL (ref 140–400)
RBC: 4.76 MIL/uL (ref 3.80–5.10)
RDW: 15.4 % — ABNORMAL HIGH (ref 11.0–15.0)
WBC: 5 10*3/uL (ref 3.8–10.8)

## 2016-08-11 LAB — TSH: TSH: 1.34 m[IU]/L

## 2016-08-11 NOTE — Assessment & Plan Note (Signed)
USPSTF grade A and B recommendations reviewed with patient; age-appropriate recommendations, preventive care, screening tests, etc discussed and encouraged; healthy living encouraged; see AVS for patient education given to patient  

## 2016-08-11 NOTE — Progress Notes (Signed)
Patient ID: Paula Yang, female   DOB: 1965/12/02, 51 y.o.   MRN: [id number]   Subjective:   Paula Yang is a 51 y.o. female here for a complete physical exam  Interim issues since last visit: mother died in 05/20/2022 from kidney failure  USPSTF grade A and B recommendations Depression:  Depression screen [facility] 2/9 08/11/2016 09/04/2015 03/26/2015 09/27/2014  Decreased Interest 0 0 0 0  Down, Depressed, Hopeless 1 0 0 0  PHQ - 2 Score 1 0 0 0   Hypertension:well-controlled BP Readings from Last 3 Encounters:  08/11/16 118/74  01/07/16 130/80  09/04/15 122/82   Obesity: Wt Readings from Last 3 Encounters:  08/11/16 237 lb 9.6 oz (107.8 kg)  01/07/16 225 lb (102.1 kg)  09/04/15 224 lb (101.6 kg)   BMI Readings from Last 3 Encounters:  08/11/16 36.66 kg/m  01/07/16 35.24 kg/m  09/04/15 35.08 kg/m    Alcohol: no Tobacco use: no HIV, hep B, hep C: hiv today STD testing and prevention (chl/gon/syphilis): declined Intimate partner violence: no abuse Breast cancer: no lumps BRCA gene screening:  Breast cancer (maternal aunt, grandmother, another aunt may have it); no ovarian cancer known Cervical cancer screening: UTD Osteoporosis: n/a Fall prevention/vitamin D: recommended 1000 iu daily Lipids:  Lab Results  Component Value Date   CHOL 187 01/07/2016   CHOL 201 (A) 05/18/2013   CHOL 179 12/09/2012   Lab Results  Component Value Date   HDL 42 (L) 01/07/2016   HDL 52 05/18/2013   HDL 36 (L) 12/09/2012   Lab Results  Component Value Date   LDLCALC 120 01/07/2016   LDLCALC 134 05/18/2013   LDLCALC 99 12/09/2012   Lab Results  Component Value Date   TRIG 125 01/07/2016   TRIG 75 05/18/2013   TRIG 219 (H) 12/09/2012   Lab Results  Component Value Date   CHOLHDL 4.5 01/07/2016   No results found for: LDLDIRECT  Glucose:  Glucose  Date Value Ref Range Status  03/26/2015 97 65 - 99 mg/dL Final  12/09/2012 149 (H) 65 - 99 mg/dL Final  12/08/2012  125 (H) 65 - 99 mg/dL Final  06/21/2012 89 65 - 99 mg/dL Final   Glucose, Bld  Date Value Ref Range Status  01/07/2016 108 (H) 65 - 99 mg/dL Final  03/20/2015 150 (H) 65 - 99 mg/dL Final  03/20/2015 151 (H) 65 - 99 mg/dL Final   Colorectal cancer: start, colonoscopy Lung cancer:  n/a AAA: n/a Aspirin: not taking, discussed Diet: stress-eating lately; discussed calcium; greens Exercise: just recently started back with walking Skin cancer: nothing worrisome; tags on the face, one on the right upper eyelid  Past Medical History:  Diagnosis Date  . Chronic lumbosacral pain   . Gastroesophageal reflux disease without esophagitis   . Genital herpes simplex type 2   . Headache, variant migraine   . HTN, goal below 140/90   . Mild intermittent asthma in adult without complication    Past Surgical History:  Procedure Laterality Date  . CESAREAN SECTION  1991   Family History  Problem Relation Age of Onset  . Heart disease Brother   . Heart disease Maternal Aunt   . Diabetes Mother   . Kidney disease Mother     died at 63 yo from kidney failure  . Diabetes Maternal Aunt   . Diabetes Maternal Uncle   . Diabetes Maternal Grandmother   . Breast cancer Maternal Grandmother   . Breast cancer Maternal Aunt   .  Colon cancer Paternal Grandfather    Social History  Substance Use Topics  . Smoking status: Never Smoker  . Smokeless tobacco: Never Used  . Alcohol use No   Review of Systems  Objective:   Vitals:   08/11/16 0815  BP: 118/74  Pulse: 90  Resp: 14  Temp: 98.4 F (36.9 C)  TempSrc: Oral  SpO2: 98%  Weight: 237 lb 9.6 oz (107.8 kg)  Height: 5' 7.5" (1.715 m)   Body mass index is 36.66 kg/m. Wt Readings from Last 3 Encounters:  08/11/16 237 lb 9.6 oz (107.8 kg)  01/07/16 225 lb (102.1 kg)  09/04/15 224 lb (101.6 kg)   Physical Exam  Constitutional: She appears well-developed and well-nourished.  Obese, weight gain noted  HENT:  Head: Normocephalic and  atraumatic.  Right Ear: Hearing, tympanic membrane, external ear and ear canal normal.  Left Ear: Hearing, tympanic membrane, external ear and ear canal normal.  Eyes: Conjunctivae and EOM are normal. Right eye exhibits no hordeolum. Left eye exhibits no hordeolum. No scleral icterus.  Neck: Carotid bruit is not present. No thyromegaly present.  Cardiovascular: Normal rate, regular rhythm, S1 normal, S2 normal and normal heart sounds.  Frequent extrasystoles are present.  Pulmonary/Chest: Effort normal and breath sounds normal. No respiratory distress. Right breast exhibits no inverted nipple, no mass, no nipple discharge, no skin change and no tenderness. Left breast exhibits tenderness (lateral to areola, "not new" per patient, always like that). Left breast exhibits no inverted nipple, no mass, no nipple discharge and no skin change. Breasts are symmetrical.  Abdominal: Soft. Normal appearance and bowel sounds are normal. She exhibits no distension, no abdominal bruit, no pulsatile midline mass and no mass. There is no hepatosplenomegaly. There is no tenderness. No hernia.  Musculoskeletal: Normal range of motion. She exhibits no edema.  Lymphadenopathy:       Head (right side): No submandibular adenopathy present.       Head (left side): No submandibular adenopathy present.    She has no cervical adenopathy.    She has no axillary adenopathy.  Neurological: She is alert. She displays no tremor. No cranial nerve deficit. She exhibits normal muscle tone. Gait normal.  Reflex Scores:      Patellar reflexes are 2+ on the right side and 2+ on the left side. Skin: Skin is warm and dry. No bruising and no ecchymosis noted. No cyanosis. No pallor.  Psychiatric: Her speech is normal and behavior is normal. Thought content normal. Her mood appears not anxious. She does not exhibit a depressed mood.    Assessment/Plan:   Problem List Items Addressed This Visit      Other   Elevated glucose     Check glucose and A1c      Relevant Orders   Hemoglobin A1c   Annual physical exam - Primary    USPSTF grade A and B recommendations reviewed with patient; age-appropriate recommendations, preventive care, screening tests, etc discussed and encouraged; healthy living encouraged; see AVS for patient education given to patient       Relevant Orders   CBC with Differential/Platelet   COMPLETE METABOLIC PANEL WITH GFR   Lipid panel   TSH    Other Visit Diagnoses    Screening for HIV (human immunodeficiency virus)       Relevant Orders   HIV antibody (with reflex)   Screen for colon cancer       Relevant Orders   Ambulatory referral to Gastroenterology  Screening for breast cancer       Relevant Orders   MM Digital Screening   Skin tag       Relevant Orders   Ambulatory referral to Dermatology   Leg cramps       Relevant Orders   Magnesium   Palpitation       EKG showed frequent PVCs, interpreted by me; check K+ and Mg2+   Relevant Orders   Magnesium   EKG 12-Lead   Weight gain       after death of mother, stress-eating; encouraged gradual weight loss, see AVS; check TSH today       Meds ordered this encounter  Medications  . aspirin EC 81 MG tablet    Sig: Take 1 tablet (81 mg total) by mouth daily.   Orders Placed This Encounter  Procedures  . MM Digital Screening    Standing Status:   Future    Number of Occurrences:   1    Standing Expiration Date:   10/11/2017    Order Specific Question:   Reason for Exam (SYMPTOM  OR DIAGNOSIS REQUIRED)    Answer:   screen for breat cancer    Order Specific Question:   Is the patient pregnant?    Answer:   No    Order Specific Question:   Preferred imaging location?    Answer:    [facility]  . HIV antibody (with reflex)  . CBC with Differential/Platelet  . COMPLETE METABOLIC PANEL WITH GFR  . Lipid panel  . TSH  . Hemoglobin A1c  . Magnesium  . Ambulatory referral to Gastroenterology    Referral Priority:    Routine    Referral Type:   Consultation    Referral Reason:   Specialty Services Required    Number of Visits Requested:   1  . Ambulatory referral to Dermatology    Referral Priority:   Routine    Referral Type:   Consultation    Referral Reason:   Specialty Services Required    Requested Specialty:   Dermatology    Number of Visits Requested:   1  . EKG 12-Lead    Follow up plan: Return in about 1 year (around 08/11/2017) for complete physical; 6 months for elevated glucose.  An After Visit Summary was printed and given to the patient.

## 2016-08-11 NOTE — Patient Instructions (Addendum)
I recommend 1,000 iu of vitamin D3 once a day  Check out the information at familydoctor.org entitled "Nutrition for Weight Loss: What You Need to Know about Fad Diets" Try to lose between 1-2 pounds per week by taking in fewer calories and burning off more calories You can succeed by limiting portions, limiting foods dense in calories and fat, becoming more active, and drinking 8 glasses of water a day (64 ounces) Don't skip meals, especially breakfast, as skipping meals may alter your metabolism Do not use over-the-counter weight loss pills or gimmicks that claim rapid weight loss A healthy BMI (or body mass index) is between 18.5 and 24.9 You can calculate your ideal BMI at the [place] website [employer]fr Health Maintenance, Female Adopting a healthy lifestyle and getting preventive care can go a long way to promote health and wellness. Talk with your health care provider about what schedule of regular examinations is right for you. This is a good chance for you to check in with your provider about disease prevention and staying healthy. In between checkups, there are plenty of things you can do on your own. Experts have done a lot of research about which lifestyle changes and preventive measures are most likely to keep you healthy. Ask your health care provider for more information. Weight and diet Eat a healthy diet  Be sure to include plenty of vegetables, fruits, low-fat dairy products, and lean protein.  Do not eat a lot of foods high in solid fats, added sugars, or salt.  Get regular exercise. This is one of the most important things you can do for your health.  Most adults should exercise for at least 150 minutes each week. The exercise should increase your heart rate and make you sweat (moderate-intensity exercise).  Most adults should also do strengthening exercises at least twice a week. This is in addition to the  moderate-intensity exercise. Maintain a healthy weight  Body mass index (BMI) is a measurement that can be used to identify possible weight problems. It estimates body fat based on height and weight. Your health care provider can help determine your BMI and help you achieve or maintain a healthy weight.  For females 40 years of age and older:  A BMI below 18.5 is considered underweight.  A BMI of 18.5 to 24.9 is normal.  A BMI of 25 to 29.9 is considered overweight.  A BMI of 30 and above is considered obese. Watch levels of cholesterol and blood lipids  You should start having your blood tested for lipids and cholesterol at 50 years of age, then have this test every 5 years.  You may need to have your cholesterol levels checked more often if:  Your lipid or cholesterol levels are high.  You are older than 50 years of age.  You are at high risk for heart disease. Cancer screening Lung Cancer  Lung cancer screening is recommended for adults 82-22 years old who are at high risk for lung cancer because of a history of smoking.  A yearly low-dose CT scan of the lungs is recommended for people who:  Currently smoke.  Have quit within the past 15 years.  Have at least a 30-pack-year history of smoking. A pack year is smoking an average of one pack of cigarettes a day for 1 year.  Yearly screening should continue until it has been 15 years since you quit.  Yearly screening should stop if you develop a health problem that would prevent you from having  lung cancer treatment. Breast Cancer  Practice breast self-awareness. This means understanding how your breasts normally appear and feel.  It also means doing regular breast self-exams. Let your health care provider know about any changes, no matter how small.  If you are in your 20s or 30s, you should have a clinical breast exam (CBE) by a health care provider every 1-3 years as part of a regular health exam.  If you are 75 or  older, have a CBE every year. Also consider having a breast X-ray (mammogram) every year.  If you have a family history of breast cancer, talk to your health care provider about genetic screening.  If you are at high risk for breast cancer, talk to your health care provider about having an MRI and a mammogram every year.  Breast cancer gene (BRCA) assessment is recommended for women who have family members with BRCA-related cancers. BRCA-related cancers include:  Breast.  Ovarian.  Tubal.  Peritoneal cancers.  Results of the assessment will determine the need for genetic counseling and BRCA1 and BRCA2 testing. Cervical Cancer  Your health care provider may recommend that you be screened regularly for cancer of the pelvic organs (ovaries, uterus, and vagina). This screening involves a pelvic examination, including checking for microscopic changes to the surface of your cervix (Pap test). You may be encouraged to have this screening done every 3 years, beginning at age 29.  For women ages 51-65, health care providers may recommend pelvic exams and Pap testing every 3 years, or they may recommend the Pap and pelvic exam, combined with testing for human papilloma virus (HPV), every 5 years. Some types of HPV increase your risk of cervical cancer. Testing for HPV may also be done on women of any age with unclear Pap test results.  Other health care providers may not recommend any screening for nonpregnant women who are considered low risk for pelvic cancer and who do not have symptoms. Ask your health care provider if a screening pelvic exam is right for you.  If you have had past treatment for cervical cancer or a condition that could lead to cancer, you need Pap tests and screening for cancer for at least 20 years after your treatment. If Pap tests have been discontinued, your risk factors (such as having a new sexual partner) need to be reassessed to determine if screening should resume. Some  women have medical problems that increase the chance of getting cervical cancer. In these cases, your health care provider may recommend more frequent screening and Pap tests. Colorectal Cancer  This type of cancer can be detected and often prevented.  Routine colorectal cancer screening usually begins at 50 years of age and continues through 50 years of age.  Your health care provider may recommend screening at an earlier age if you have risk factors for colon cancer.  Your health care provider may also recommend using home test kits to check for hidden blood in the stool.  A small camera at the end of a tube can be used to examine your colon directly (sigmoidoscopy or colonoscopy). This is done to check for the earliest forms of colorectal cancer.  Routine screening usually begins at age 92.  Direct examination of the colon should be repeated every 5-10 years through 50 years of age. However, you may need to be screened more often if early forms of precancerous polyps or small growths are found. Skin Cancer  Check your skin from head to toe regularly.  Tell your health care provider about any new moles or changes in moles, especially if there is a change in a mole's shape or color.  Also tell your health care provider if you have a mole that is larger than the size of a pencil eraser.  Always use sunscreen. Apply sunscreen liberally and repeatedly throughout the day.  Protect yourself by wearing long sleeves, pants, a wide-brimmed hat, and sunglasses whenever you are outside. Heart disease, diabetes, and high blood pressure  High blood pressure causes heart disease and increases the risk of stroke. High blood pressure is more likely to develop in:  People who have blood pressure in the high end of the normal range (130-139/85-89 mm Hg).  People who are overweight or obese.  People who are African American.  If you are 56-41 years of age, have your blood pressure checked every  3-5 years. If you are 32 years of age or older, have your blood pressure checked every year. You should have your blood pressure measured twice-once when you are at a hospital or clinic, and once when you are not at a hospital or clinic. Record the average of the two measurements. To check your blood pressure when you are not at a hospital or clinic, you can use:  An automated blood pressure machine at a pharmacy.  A home blood pressure monitor.  If you are between 59 years and 36 years old, ask your health care provider if you should take aspirin to prevent strokes.  Have regular diabetes screenings. This involves taking a blood sample to check your fasting blood sugar level.  If you are at a normal weight and have a low risk for diabetes, have this test once every three years after 50 years of age.  If you are overweight and have a high risk for diabetes, consider being tested at a younger age or more often. Preventing infection Hepatitis B  If you have a higher risk for hepatitis B, you should be screened for this virus. You are considered at high risk for hepatitis B if:  You were born in a country where hepatitis B is common. Ask your health care provider which countries are considered high risk.  Your parents were born in a high-risk country, and you have not been immunized against hepatitis B (hepatitis B vaccine).  You have HIV or AIDS.  You use needles to inject street drugs.  You live with someone who has hepatitis B.  You have had sex with someone who has hepatitis B.  You get hemodialysis treatment.  You take certain medicines for conditions, including cancer, organ transplantation, and autoimmune conditions. Hepatitis C  Blood testing is recommended for:  Everyone born from [birth year] through 1964.  Anyone with known risk factors for hepatitis C. Sexually transmitted infections (STIs)  You should be screened for sexually transmitted infections (STIs) including  gonorrhea and chlamydia if:  You are sexually active and are younger than 50 years of age.  You are older than 50 years of age and your health care provider tells you that you are at risk for this type of infection.  Your sexual activity has changed since you were last screened and you are at an increased risk for chlamydia or gonorrhea. Ask your health care provider if you are at risk.  If you do not have HIV, but are at risk, it may be recommended that you take a prescription medicine daily to prevent HIV infection. This is called pre-exposure prophylaxis (PrEP).  You are considered at risk if:  You are sexually active and do not regularly use condoms or know the HIV status of your partner(s).  You take drugs by injection.  You are sexually active with a partner who has HIV. Talk with your health care provider about whether you are at high risk of being infected with HIV. If you choose to begin PrEP, you should first be tested for HIV. You should then be tested every 3 months for as long as you are taking PrEP. Pregnancy  If you are premenopausal and you may become pregnant, ask your health care provider about preconception counseling.  If you may become pregnant, take 400 to 800 micrograms (mcg) of folic acid every day.  If you want to prevent pregnancy, talk to your health care provider about birth control (contraception). Osteoporosis and menopause  Osteoporosis is a disease in which the bones lose minerals and strength with aging. This can result in serious bone fractures. Your risk for osteoporosis can be identified using a bone density scan.  If you are 33 years of age or older, or if you are at risk for osteoporosis and fractures, ask your health care provider if you should be screened.  Ask your health care provider whether you should take a calcium or vitamin D supplement to lower your risk for osteoporosis.  Menopause may have certain physical symptoms and risks.  Hormone  replacement therapy may reduce some of these symptoms and risks. Talk to your health care provider about whether hormone replacement therapy is right for you. Follow these instructions at home:  Schedule regular health, dental, and eye exams.  Stay current with your immunizations.  Do not use any tobacco products including cigarettes, chewing tobacco, or electronic cigarettes.  If you are pregnant, do not drink alcohol.  If you are breastfeeding, limit how much and how often you drink alcohol.  Limit alcohol intake to no more than 1 drink per day for nonpregnant women. One drink equals 12 ounces of beer, 5 ounces of wine, or 1 ounces of hard liquor.  Do not use street drugs.  Do not share needles.  Ask your health care provider for help if you need support or information about quitting drugs.  Tell your health care provider if you often feel depressed.  Tell your health care provider if you have ever been abused or do not feel safe at home. This information is not intended to replace advice given to you by your health care provider. Make sure you discuss any questions you have with your health care provider. Document Released: 10/12/2010 Document Revised: 09/04/2015 Document Reviewed: 12/31/2014 Elsevier Interactive Patient Education  2017 [employer].

## 2016-08-11 NOTE — Assessment & Plan Note (Signed)
Check glucose and A1c 

## 2016-08-12 ENCOUNTER — Other Ambulatory Visit: Payer: Self-pay | Admitting: Family Medicine

## 2016-08-12 LAB — MAGNESIUM: Magnesium: 1.6 mg/dL (ref 1.5–2.5)

## 2016-08-12 LAB — HIV ANTIBODY (ROUTINE TESTING W REFLEX): HIV 1&2 Ab, 4th Generation: NONREACTIVE

## 2016-08-12 LAB — HEMOGLOBIN A1C
HEMOGLOBIN A1C: 6 % — AB (ref <5.7)
Mean Plasma Glucose: 126 mg/dL

## 2016-08-12 MED ORDER — METFORMIN HCL ER 500 MG PO TB24: ORAL_TABLET | ORAL | 0 refills | Status: DC

## 2016-08-12 MED ORDER — ALBUTEROL SULFATE HFA 108 (90 BASE) MCG/ACT IN AERS: 1.0000 | INHALATION_SPRAY | RESPIRATORY_TRACT | 0 refills | Status: DC | PRN

## 2016-08-12 NOTE — Progress Notes (Signed)
Start metformin.

## 2016-08-13 ENCOUNTER — Other Ambulatory Visit: Payer: Self-pay

## 2016-08-13 ENCOUNTER — Telehealth: Payer: Self-pay

## 2016-08-13 DIAGNOSIS — Z1211 Encounter for screening for malignant neoplasm of colon: Secondary | ICD-10-CM

## 2016-08-13 DIAGNOSIS — R002 Palpitations: Secondary | ICD-10-CM

## 2016-08-13 NOTE — Telephone Encounter (Signed)
Gastroenterology Pre-Procedure Review  Request Date: 09/07/16 Requesting Physician: Dr. Servando SnareWohl  PATIENT REVIEW QUESTIONS: The patient responded to the following health history questions as indicated:    1. Are you having any GI issues? no 2. Do you have a personal history of Polyps? no 3. Do you have a family history of Colon Cancer or Polyps? yes (Moaternal Grandfather and [employer] Aunt Colon Cancer) 4. Diabetes Mellitus? yes (self) 5. Joint replacements in the past 12 months?no 6. Major health problems in the past 3 months?no 7. Any artificial heart valves, MVP, or defibrillator?no    MEDICATIONS & ALLERGIES:    Patient reports the following regarding taking any anticoagulation/antiplatelet therapy:   Plavix, Coumadin, Eliquis, Xarelto, Lovenox, Pradaxa, Brilinta, or Effient? no Aspirin? no  Patient confirms/reports the following medications:  Current Outpatient Prescriptions  Medication Sig Dispense Refill  . acyclovir (ZOVIRAX) 400 MG tablet TAKE 1 TABLET BY MOUTH TWICE A DAY 60 tablet 5  . albuterol (PROAIR HFA) 108 (90 Base) MCG/ACT inhaler Inhale 1-2 puffs into the lungs every 4 (four) hours as needed for wheezing. 1 Inhaler 0  . aspirin EC 81 MG tablet Take 1 tablet (81 mg total) by mouth daily.    . fluticasone (FLONASE) 50 MCG/ACT nasal spray Place 2 sprays into both nostrils daily as needed for allergies. 48 g 1  . furosemide (LASIX) 20 MG tablet Take 1 tablet (20 mg total) by mouth daily as needed. 30 tablet 3  . lisinopril-hydrochlorothiazide (PRINZIDE,ZESTORETIC) 20-25 MG tablet Take 1 tablet by mouth daily. 90 tablet 1  . metFORMIN (GLUCOPHAGE XR) 500 MG 24 hr tablet One by mouth daily x 1 week, then two daily x 1 week, then three daily 70 tablet 0  . potassium chloride (K-DUR) 10 MEQ tablet Take 1 tablet (10 mEq total) by mouth daily as needed. 30 tablet 6   No current facility-administered medications for this visit.     Patient confirms/reports the following allergies:   No Known Allergies  No orders of the defined types were placed in this encounter.   AUTHORIZATION INFORMATION Primary Insurance: 1D#: Group #:  Secondary Insurance: 1D#: Group #:  SCHEDULE INFORMATION: Date: Tue May 29th Dr. Servando SnareWohl Time: Location:[facility]

## 2016-08-14 ENCOUNTER — Telehealth: Payer: Self-pay | Admitting: Family Medicine

## 2016-08-14 DIAGNOSIS — I493 Ventricular premature depolarization: Secondary | ICD-10-CM

## 2016-08-14 NOTE — Telephone Encounter (Signed)
Previous patient of Dr. Windell HummingbirdGollan's; holter monitor already ordered; will refer back to Dr. Mariah MillingGollan to see if additional work-up needed; she is on aspirin; message left for patient on phone

## 2016-08-14 NOTE — Assessment & Plan Note (Signed)
Previously seen by Dr. Mariah MillingGollan; will refer back to see if additional testing needed now that she's over 50

## 2016-08-17 ENCOUNTER — Encounter: Payer: Self-pay | Admitting: Cardiovascular Disease

## 2016-08-17 ENCOUNTER — Ambulatory Visit (INDEPENDENT_AMBULATORY_CARE_PROVIDER_SITE_OTHER): Payer: BLUE CROSS/BLUE SHIELD | Admitting: Cardiovascular Disease

## 2016-08-17 ENCOUNTER — Ambulatory Visit (INDEPENDENT_AMBULATORY_CARE_PROVIDER_SITE_OTHER): Payer: BLUE CROSS/BLUE SHIELD

## 2016-08-17 VITALS — BP 130/72 | HR 85 | Ht 67.0 in | Wt 234.8 lb

## 2016-08-17 DIAGNOSIS — R0602 Shortness of breath: Secondary | ICD-10-CM | POA: Diagnosis not present

## 2016-08-17 DIAGNOSIS — I493 Ventricular premature depolarization: Secondary | ICD-10-CM | POA: Diagnosis not present

## 2016-08-17 NOTE — Patient Instructions (Signed)
Medication Instructions:   No medication changes made  Labwork:  No new labs needed  Testing/Procedures:  We will order a routine treadmill study   I recommend watching educational videos on topics of interest to you at:       [email or web address]  Enter code: HEARTCARE    Follow-Up: It was a pleasure seeing you in the office today. Please call us if you have new issues that need to be addressed before your next appt.  [phone]  Your physician wants you to follow-up in: as needed  If you need a refill on your cardiac medications before your next appointment, please call your pharmacy.

## 2016-08-17 NOTE — Progress Notes (Signed)
Cardiology Office Note  Date:  08/17/2016   ID:  Paula Yang, DOB [birth year], MRN [id number]  PCP:  Kerman Passey, MD   Chief Complaint  Patient presents with  . other    Dr. Sherie Don would like the patient evaluated for more frequent PVC's and shortness of breath. Meds reviewed by the pt. verbally. Pt. c/o shortness of breath with little exertion. Pt.'s mom passed away on 2016-06-04 and has been eating more due to her loss.     HPI:  Ms. Paula Yang is a pleasant 51 year old woman with a history of chronic shortness of breath  Obesity  Asthma, Echo in 05/2015: Normal Self-reported cardiac catheterization in the past by Dr. Juliann Pares for similar symptoms Previous CT scan chest with no abnormality, no coronary calcifications Who presents for routine follow-up of her shortness of breath and PVCs  She reports having shortness of breath dating back many years Reports she is short of breath all the time but worse with exertion Also worse when she is laying down on her left side, better on her back  Reports having periodic dizziness, sluggish speech, leg cramps  Lost her mother in February 2018,  Reports that she recently started walking  On her last clinic visit she was given Lasix to take as needed for shortness of breath, hand swelling me fluid retention. She had poor diet, high fluid and salt intake Takes lasix every 4 to 5 days Takes HCTZ daily  Severe shortness of breath 03/20/2015, she called EMS, taken to the hospital  CT scan of the chest ,  no PE, no PAD or coronary artery disease, cardiac size looked regular  history of snoring, does not know she has sleep apnea.  Boyfriend previously reports that she has significant snoring problem  Weight has been trending up over the past several years  On her last clinic visit she reported having leg swelling and hand swelling  On today's visit, She reports that she feels her PVCs  On her previous visit, she reported that she  can feel it up to 10 times per day  EKG personally reviewed by myself on todays visit Shows normal sinus rhythm with rate 77 bpm PVCs in a trigeminal pattern    PMH:   has a past medical history of Chronic lumbosacral pain; Gastroesophageal reflux disease without esophagitis; Genital herpes simplex type 2; Headache, variant migraine; HTN, goal below 140/90; and Mild intermittent asthma in adult without complication.  PSH:    Past Surgical History:  Procedure Laterality Date  . CESAREAN SECTION  1991    Current Outpatient Prescriptions  Medication Sig Dispense Refill  . acyclovir (ZOVIRAX) 400 MG tablet TAKE 1 TABLET BY MOUTH TWICE A DAY 60 tablet 5  . albuterol (PROAIR HFA) 108 (90 Base) MCG/ACT inhaler Inhale 1-2 puffs into the lungs every 4 (four) hours as needed for wheezing. 1 Inhaler 0  . aspirin EC 81 MG tablet Take 1 tablet (81 mg total) by mouth daily.    . fluticasone (FLONASE) 50 MCG/ACT nasal spray Place 2 sprays into both nostrils daily as needed for allergies. 48 g 1  . furosemide (LASIX) 20 MG tablet Take 1 tablet (20 mg total) by mouth daily as needed. 30 tablet 3  . lisinopril-hydrochlorothiazide (PRINZIDE,ZESTORETIC) 20-25 MG tablet Take 1 tablet by mouth daily. 90 tablet 1  . potassium chloride (K-DUR) 10 MEQ tablet Take 1 tablet (10 mEq total) by mouth daily as needed. 30 tablet 6   No current  facility-administered medications for this visit.      Allergies:   Patient has no known allergies.   Social History:  The patient  reports that she has never smoked. She has never used smokeless tobacco. She reports that she does not drink alcohol or use drugs.   Family History:   family history includes Breast cancer in her maternal aunt and maternal grandmother; Colon cancer in her paternal grandfather; Diabetes in her maternal aunt, maternal grandmother, maternal uncle, and mother; Heart disease in her brother and maternal aunt; Kidney disease in her mother.    Review  of Systems: Review of Systems  Constitutional: Negative.   Respiratory: Positive for shortness of breath.   Cardiovascular: Positive for palpitations.  Gastrointestinal: Negative.   Musculoskeletal: Negative.   Neurological: Negative.   Psychiatric/Behavioral: Negative.   All other systems reviewed and are negative.    PHYSICAL EXAM: VS:  BP 130/72 (BP Location: Left Arm, Patient Position: Sitting, Cuff Size: Large)   Pulse 85   Ht 5\' 7"  (1.702 m)   Wt 234 lb 12 oz (106.5 kg)   LMP 07/20/2016   BMI 36.77 kg/m  , BMI Body mass index is 36.77 kg/m. GEN: Well nourished, well developed, in no acute distress , obese HEENT: normal  Neck: no JVD, carotid bruits, or masses Cardiac: RRR; no murmurs, rubs, or gallops,no edema  Respiratory:  clear to auscultation bilaterally, normal work of breathing GI: soft, nontender, nondistended, + BS MS: no deformity or atrophy  Skin: warm and dry, no rash Neuro:  Strength and sensation are intact Psych: euthymic mood, full affect    Recent Labs: 08/11/2016: ALT 14; BUN 13; Creat 0.78; Hemoglobin 12.2; Magnesium 1.6; Platelets 243; Potassium 4.2; Sodium 135; TSH 1.34    Lipid Panel Lab Results  Component Value Date   CHOL 183 08/11/2016   HDL 41 (L) 08/11/2016   LDLCALC 125 (H) 08/11/2016   TRIG 87 08/11/2016      Wt Readings from Last 3 Encounters:  08/17/16 234 lb 12 oz (106.5 kg)  08/11/16 237 lb 9.6 oz (107.8 kg)  01/07/16 225 lb (102.1 kg)       ASSESSMENT AND PLAN:  SOB (shortness of breath) Etiology of her shortness of breath is unclear, Suspect obesity and deconditioning Suspect noncardiac Previous cardiac workup is extensive including echocardiogram, chest CT scan Patient reports having prior cardiac catheterization Normal cardiac function on echo, no coronary calcifications on CT  She had routine treadmill exercise stress test today,  able to walk for 6 minutes PVCs resolved with exercise She was unable to walk  past 6 minutes into the third stage secondary to conditioning and shortness of breath. This would exclude PVCs as a contributor to her shortness of breath symptoms  Would recommend she start a regular exercise program, diet changes for weight loss  If she feels she needs additional workup, she could have pulmonary function tests with follow-up with pulmonary  High suspicion of sleep apnea and may benefit from a sleep study  Frequent PVCs  likely of little clinical significance given normal ejection fraction, suppression with treadmill/exercise  in general is asymptomatic Would avoid beta blockers given history of asthma  Disposition:   F/U  As needed   Total encounter time more than 25 minutes  Greater than 50% was spent in counseling and coordination of care with the patient   No orders of the defined types were placed in this encounter.    Lazarus SalinesSigned, Tim Kealie Barrie, M.D., Ph.D. 08/17/2016  [place], [place]

## 2016-08-18 ENCOUNTER — Telehealth: Payer: Self-pay | Admitting: Gastroenterology

## 2016-08-18 NOTE — Telephone Encounter (Signed)
08/18/16 Spoke with Vanessa S at [employer] and NO prior auth is required for Screening Colonoscopy 45378 / Z12.11 

## 2016-08-19 ENCOUNTER — Other Ambulatory Visit: Payer: Self-pay

## 2016-08-19 DIAGNOSIS — R002 Palpitations: Secondary | ICD-10-CM

## 2016-08-19 LAB — EXERCISE TOLERANCE TEST
CSEPEDS: 0 s
Estimated workload: 7 METS
Exercise duration (min): 6 min
MPHR: 170 {beats}/min
Peak HR: 148 {beats}/min
Percent HR: 87 %
Rest HR: 95 {beats}/min

## 2016-09-07 ENCOUNTER — Encounter: Admission: RE | Payer: Self-pay | Source: Ambulatory Visit

## 2016-09-07 ENCOUNTER — Telehealth: Payer: Self-pay | Admitting: Gastroenterology

## 2016-09-07 ENCOUNTER — Ambulatory Visit
Admission: RE | Admit: 2016-09-07 | Payer: BLUE CROSS/BLUE SHIELD | Source: Ambulatory Visit | Admitting: Gastroenterology

## 2016-09-07 SURGERY — COLONOSCOPY WITH PROPOFOL
Anesthesia: General

## 2016-09-07 NOTE — Telephone Encounter (Signed)
Patient LVM regarding cardiac clearance and to r/s procedure.

## 2016-09-07 NOTE — Telephone Encounter (Signed)
Pt stated that she canceled colonoscopy because she was concerned about her heart and taking the bowel prep causing problems.  I told her that we could reschedule at a later time once she felt more comfortable having it performed.

## 2016-09-24 ENCOUNTER — Telehealth: Payer: Self-pay

## 2016-09-24 DIAGNOSIS — I493 Ventricular premature depolarization: Secondary | ICD-10-CM

## 2016-09-24 NOTE — Assessment & Plan Note (Signed)
Patient requests referral to 2nd cardiologist

## 2016-09-24 NOTE — Telephone Encounter (Signed)
Patient called states she is still having symptoms with heart throwing PVC'S, she has seen Dr. Mariah MillingGollan but is not liking what she hears.  Wants to see about getting 2nd opinion referral to Dr. Juliann Paresallwood?

## 2016-09-24 NOTE — Telephone Encounter (Signed)
Referral entered; thank you 

## 2016-11-24 ENCOUNTER — Other Ambulatory Visit: Payer: Self-pay | Admitting: Family Medicine

## 2016-11-24 DIAGNOSIS — I10 Essential (primary) hypertension: Secondary | ICD-10-CM

## 2016-11-24 NOTE — Telephone Encounter (Signed)
Last Cr and K+ reviewed; Rx approved 

## 2016-12-09 ENCOUNTER — Telehealth: Payer: Self-pay | Admitting: Family Medicine

## 2016-12-09 NOTE — Telephone Encounter (Signed)
Please contact the breast center See where patient stands with breast imaging It appears that she is due for breast imaging Order appropriate testing (diagnostic vs screening) Thank you

## 2016-12-14 ENCOUNTER — Other Ambulatory Visit: Payer: Self-pay

## 2016-12-14 DIAGNOSIS — Z1239 Encounter for other screening for malignant neoplasm of breast: Secondary | ICD-10-CM

## 2016-12-14 DIAGNOSIS — R928 Other abnormal and inconclusive findings on diagnostic imaging of breast: Secondary | ICD-10-CM

## 2016-12-14 DIAGNOSIS — R921 Mammographic calcification found on diagnostic imaging of breast: Secondary | ICD-10-CM

## 2016-12-14 NOTE — Telephone Encounter (Signed)
Called diagnostic mammo order, they will call to set up appt

## 2017-02-14 ENCOUNTER — Ambulatory Visit: Payer: BLUE CROSS/BLUE SHIELD | Admitting: Family Medicine

## 2017-03-10 ENCOUNTER — Other Ambulatory Visit: Payer: Self-pay | Admitting: Family Medicine

## 2017-03-29 ENCOUNTER — Telehealth: Payer: Self-pay | Admitting: Family Medicine

## 2017-03-29 NOTE — Telephone Encounter (Signed)
Please encourage patient to get her screening mammogram done as soon as possible Ask her to go ahead and get this scheduled, after Christmas is fine, but we'd really love her to get this done ASAP She'll be glad she did and can cross that off of her "to do" list when she's finished Thank you 

## 2017-03-31 NOTE — Telephone Encounter (Signed)
Called pt no answer. LM for pt to remind her of the need to have mammogram done.

## 2017-05-07 ENCOUNTER — Other Ambulatory Visit: Payer: Self-pay | Admitting: Family Medicine

## 2017-05-07 DIAGNOSIS — I10 Essential (primary) hypertension: Secondary | ICD-10-CM

## 2017-05-09 NOTE — Telephone Encounter (Signed)
Called pt informed her of the information below. Pt states that she is having insurance issues which is why she has not been able to be seen. Pt states she will call and schedule and appt as soon as she gets approval letter from insurance.

## 2017-05-09 NOTE — Telephone Encounter (Signed)
Please call patient 1.  Urge her to get her breast imaging done (diag mammo and US); patient has been reminded already and I still don't have results 2. Please ask her to schedule an appt with me and we'll get fasting labs at that time; she was supposed to have been seen in August I'll approve one month of medicine and we look forward to seeing her soon Thank you

## 2017-06-07 ENCOUNTER — Other Ambulatory Visit: Payer: Self-pay | Admitting: Family Medicine

## 2017-06-07 DIAGNOSIS — I10 Essential (primary) hypertension: Secondary | ICD-10-CM

## 2017-06-07 NOTE — Telephone Encounter (Signed)
See my chart message

## 2017-06-07 NOTE — Telephone Encounter (Signed)
Please see 05/07/17 note Patient really needs to be seen and have labs done Thank you

## 2017-07-10 ENCOUNTER — Other Ambulatory Visit: Payer: Self-pay | Admitting: Family Medicine

## 2017-07-10 DIAGNOSIS — J309 Allergic rhinitis, unspecified: Secondary | ICD-10-CM

## 2017-07-10 DIAGNOSIS — R0982 Postnasal drip: Secondary | ICD-10-CM

## 2017-07-10 DIAGNOSIS — I10 Essential (primary) hypertension: Secondary | ICD-10-CM

## 2017-07-10 NOTE — Telephone Encounter (Signed)
Patient was supposed to be seen in August Please call her and schedule appointment; we need a visit and labs please

## 2017-07-11 NOTE — Telephone Encounter (Signed)
Left voice message on [phone] @ 9:57 informing that dr lada called in a limited supply for her medication refill and for her to call the office to schedule an appointment.

## 2017-07-14 ENCOUNTER — Telehealth: Payer: Self-pay | Admitting: Family Medicine

## 2017-07-14 MED ORDER — ALBUTEROL SULFATE HFA 108 (90 BASE) MCG/ACT IN AERS: 1.0000 | INHALATION_SPRAY | RESPIRATORY_TRACT | 1 refills | Status: DC | PRN

## 2017-07-14 MED ORDER — FLUTICASONE PROPIONATE 50 MCG/ACT NA SUSP
2.0000 | Freq: Every day | NASAL | 1 refills | Status: DC | PRN
Start: 2017-07-14 — End: 2023-08-08

## 2017-07-14 MED ORDER — LISINOPRIL-HYDROCHLOROTHIAZIDE 20-25 MG PO TABS: 1.0000 | ORAL_TABLET | Freq: Every day | ORAL | 1 refills | Status: DC

## 2017-07-14 NOTE — Telephone Encounter (Signed)
Pt is asking if she can get enough meds until her 09/02/17 appt due to no insurance to come in for an appt until then?

## 2017-07-14 NOTE — Telephone Encounter (Signed)
Last Cr and K+ were normal in May 2018 I'm happy to refill her medicines and we'll see her in May

## 2017-07-14 NOTE — Telephone Encounter (Signed)
Pt was scheduled for CPE 08/12/17 but received a call to reschedule due to provider being out of the office. The appt was rescheduled on 07/05/17 by Everardo PacificKelly Moton for pt to come in for CPE 09/02/17. Pt states she is self pay and cannot afford to come in for back to back visits. If she can be worked in for CPE prior to 09/02/17 she is happy to come.  Pt is needing lisinopril, albuterol inhaler, and flonase. She only has 2-3 days of lisinopril left.   Please advise at [phone].  [id number]- [place], [place] - [address] [phone] (Phone) [phone] (Fax)

## 2017-07-14 NOTE — Telephone Encounter (Signed)
Please advise 

## 2017-07-14 NOTE — Telephone Encounter (Signed)
I closed the note prematurely; here is my response to getting meds before May appointment: Note    Last Cr and K+ were normal in May 2018 I'm happy to refill her medicines and we'll see her in May

## 2017-07-15 NOTE — Telephone Encounter (Signed)
Pt.notified

## 2017-07-20 ENCOUNTER — Other Ambulatory Visit: Payer: Self-pay | Admitting: Family Medicine

## 2017-07-20 DIAGNOSIS — I10 Essential (primary) hypertension: Secondary | ICD-10-CM

## 2017-08-12 ENCOUNTER — Encounter: Payer: BLUE CROSS/BLUE SHIELD | Admitting: Family Medicine

## 2017-09-02 ENCOUNTER — Encounter: Payer: BLUE CROSS/BLUE SHIELD | Admitting: Family Medicine

## 2017-09-13 ENCOUNTER — Other Ambulatory Visit: Payer: Self-pay | Admitting: Family Medicine

## 2017-09-13 DIAGNOSIS — I10 Essential (primary) hypertension: Secondary | ICD-10-CM

## 2017-09-13 NOTE — Telephone Encounter (Signed)
Patient needs an appointment She canceled two appointments in May; canceled her appointment in November She has not been seen in over a year No labs in over a year

## 2017-09-14 ENCOUNTER — Telehealth: Payer: Self-pay | Admitting: Family Medicine

## 2017-09-14 DIAGNOSIS — Z5181 Encounter for therapeutic drug level monitoring: Secondary | ICD-10-CM

## 2017-09-14 DIAGNOSIS — I10 Essential (primary) hypertension: Secondary | ICD-10-CM

## 2017-09-14 DIAGNOSIS — R7303 Prediabetes: Secondary | ICD-10-CM

## 2017-09-14 DIAGNOSIS — R718 Other abnormality of red blood cells: Secondary | ICD-10-CM

## 2017-09-14 NOTE — Telephone Encounter (Signed)
Pt scheduled appt

## 2017-09-14 NOTE — Telephone Encounter (Signed)
Copied from CRM [id number]. Topic: Quick Communication - See Telephone Encounter >> Sep 14, 2017 10:09 AM Mare LoanBurton, Donna F wrote: Pt is requesting for a second time a refill of lisinopril and to let the provider know that she has made an appt for 10/06/17  [facility] [address]  [facility] [phone]

## 2017-09-16 DIAGNOSIS — R7303 Prediabetes: Secondary | ICD-10-CM | POA: Insufficient documentation

## 2017-09-16 DIAGNOSIS — R718 Other abnormality of red blood cells: Secondary | ICD-10-CM | POA: Insufficient documentation

## 2017-09-16 MED ORDER — LISINOPRIL-HYDROCHLOROTHIAZIDE 20-25 MG PO TABS: 1.0000 | ORAL_TABLET | Freq: Every day | ORAL | 0 refills | Status: DC

## 2017-09-16 NOTE — Telephone Encounter (Signed)
LVM informing patient that provider sent in 7 pills of the lisinopril and that she needed to come in sooner than 10/06/17 to see Dr. Sherie DonLada and have labs.  Also stated in the message that provider will not refill anymore medication until patient is seen with labs due to not being seen in over a year.  Offered patient to come see Dr. Sherie DonLada on 09/20/17 @ 10:20am for a regular office visit instead of a CPE due to her not having insurance.

## 2017-09-16 NOTE — Telephone Encounter (Signed)
I'm not sure why she and I are not connecting on this I absolutely must have labs to prescribe any more blood pressure medicine It has been over a year since her last labs I will not prescribe any more medicine until I see her creatinine and her potassium If she refuses to have all of the labs drawn, then I at least need a BMP before I will prescribe any more medicine period I am really trying to look out for her These medicines are not over-the-counter and must be monitored No more medicine until I get the creatinine and potassium back

## 2017-09-16 NOTE — Telephone Encounter (Signed)
Pt states does not have ins.  And she will see you on her CPE and have done on the 27th?

## 2017-09-16 NOTE — Telephone Encounter (Signed)
I really really need for her to get labs done and then come in and see us I'll approve 7 days of pills to give her time to come in for NON-fasting labs any time in the next several days, but I will absolutely have to see her lab results for any additional medicine; we're just trying to look for her

## 2017-09-20 ENCOUNTER — Encounter (HOSPITAL_COMMUNITY): Payer: Self-pay

## 2017-09-20 ENCOUNTER — Other Ambulatory Visit: Payer: Self-pay

## 2017-09-20 ENCOUNTER — Emergency Department (HOSPITAL_COMMUNITY)
Admission: EM | Admit: 2017-09-20 | Discharge: 2017-09-20 | Disposition: A | Discharge status: Home / Self Care | Payer: BLUE CROSS/BLUE SHIELD | Attending: Emergency Medicine | Admitting: Emergency Medicine

## 2017-09-20 DIAGNOSIS — R42 Dizziness and giddiness: Secondary | ICD-10-CM | POA: Insufficient documentation

## 2017-09-20 DIAGNOSIS — Z7982 Long term (current) use of aspirin: Secondary | ICD-10-CM | POA: Insufficient documentation

## 2017-09-20 DIAGNOSIS — I10 Essential (primary) hypertension: Secondary | ICD-10-CM | POA: Insufficient documentation

## 2017-09-20 DIAGNOSIS — Z79899 Other long term (current) drug therapy: Secondary | ICD-10-CM | POA: Insufficient documentation

## 2017-09-20 DIAGNOSIS — J45909 Unspecified asthma, uncomplicated: Secondary | ICD-10-CM | POA: Insufficient documentation

## 2017-09-20 LAB — CBC
HCT: 39.7 % (ref 36.0–46.0)
Hemoglobin: 12.5 g/dL (ref 12.0–15.0)
MCH: 25.7 pg — ABNORMAL LOW (ref 26.0–34.0)
MCHC: 31.5 g/dL (ref 30.0–36.0)
MCV: 81.7 fL (ref 78.0–100.0)
Platelets: 271 10*3/uL (ref 150–400)
RBC: 4.86 MIL/uL (ref 3.87–5.11)
RDW: 14 % (ref 11.5–15.5)
WBC: 5 10*3/uL (ref 4.0–10.5)

## 2017-09-20 LAB — BASIC METABOLIC PANEL
Anion gap: 5 (ref 5–15)
BUN: 7 mg/dL (ref 6–20)
CHLORIDE: 104 mmol/L (ref 101–111)
CO2: 29 mmol/L (ref 22–32)
CREATININE: 0.77 mg/dL (ref 0.44–1.00)
Calcium: 9.2 mg/dL (ref 8.9–10.3)
GFR calc Af Amer: >60 mL/min (ref >60)
GFR calc non Af Amer: >60 mL/min (ref >60)
Glucose, Bld: 131 mg/dL — ABNORMAL HIGH (ref 65–99)
POTASSIUM: 3.7 mmol/L (ref 3.5–5.1)
SODIUM: 138 mmol/L (ref 135–145)

## 2017-09-20 LAB — CBG MONITORING, ED: GLUCOSE-CAPILLARY: 78 mg/dL (ref 65–99)

## 2017-09-20 MED ORDER — MECLIZINE HCL 25 MG PO TABS: 25.0000 mg | ORAL_TABLET | Freq: Three times a day (TID) | ORAL | 0 refills | Status: DC | PRN

## 2017-09-20 MED ORDER — MECLIZINE HCL 25 MG PO TABS
25.0000 mg | ORAL_TABLET | Freq: Once | ORAL | Status: AC
  Administered 2017-09-20: 25 mg via ORAL
  Filled 2017-09-20: qty 1

## 2017-09-20 MED ORDER — ONDANSETRON 4 MG PO TBDP: 4.0000 mg | ORAL_TABLET | Freq: Three times a day (TID) | ORAL | 0 refills | Status: DC | PRN

## 2017-09-20 NOTE — ED Triage Notes (Signed)
Pt. Stated, Paula Atlasve been dizzy since Sunday with some nausea.

## 2017-09-20 NOTE — ED Notes (Signed)
Lying portion of orthostatic vital signs unable to be obtained d/t lack of hallway bed. Patient is in chair.

## 2017-09-20 NOTE — Discharge Instructions (Signed)
Thank you for allowing me to provide your care today in the emergency department.  Take 1 tablet of meclizine every 8 hours as needed for dizziness.  You can let 1 tablet of Zofran dissolve under your tongue every 8 hours as needed for nausea or vomiting.  Take 650 mg of Tylenol once every 6 hours for pain.  Please call and schedule follow-up appointment with your primary care provider for reevaluation later this week.  Return to the emergency department if you develop significantly worsening dizziness with the worst headache of your life, if you pass out, if he is to have continuous vomiting despite taking Zofran, changes to your vision, loss of vision, or other new, concerning symptoms.

## 2017-09-20 NOTE — ED Provider Notes (Signed)
[facility] EM[facility]GENCY [facility]PART[facility] Provider Note   CSN: [id number] Arrival date & time: 09/20/17  [id number]     History   Chief Complaint Chief Complaint  Patient presents with  . Dizziness  . Nausea    HPI Paula Yang is a 52 y.o. female with a history of migraine variant headaches, HTN, GERD, and asthma who presents to the emergency department with a chief complaint of dizziness.  The patient endorses constant, waxing and waning dizziness for 2 days. Dizziness is worse with position changes. She characterizes the dizziness as "room spinning". She also reports lightheadedness with standing over the last few days and nausea that was constant for the last 2 days until she had 2 episodes of NBNB this morning then the nausea resolved. She had a HA 2 days ago that resolved yesterday after she took Tylenol. She denies fever, chills, neck pain or stiffness, tinnitus, visual changes, dysequilibrium, chest pain, dyspnea, or URI sx. No h/o similar. She reports her current symptoms do not feel like her chronic headaches.  She has been able to eat and drink without difficulty since her episode of emesis this AM.  No recent medication changes or new medications. No energy supplements or increase in caffeine.    The history is provided by the patient.  No language interpreter was used.    Past Medical History:  Diagnosis Date  . Chronic lumbosacral pain   . Gastroesophageal reflux disease without esophagitis   . Genital herpes simplex type 2   . Headache, variant migraine   . HTN, goal below 140/90   . Mild intermittent asthma in adult without complication     Patient Active Problem List   Diagnosis Date Noted  . Prediabetes 09/16/2017  . Microcytosis 09/16/2017  . Elevated glucose 12/16/2015  . Encounter for medication monitoring 12/16/2015  . Abnormal mammography 09/26/2015  . Toenail deformity 09/04/2015  . Annual physical exam 06/03/2015  . Encounter for  cholesteral screening for cardiovascular disease 06/03/2015  . Encounter for screening mammogram for malignant neoplasm of breast 06/03/2015  . Encounter for screening for malignant neoplasm of cervix 06/03/2015  . Breast mass, left 06/03/2015  . Left knee pain 06/03/2015  . 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 Shortness of breath 05/21/2015  . Morbid obesity (HCC) 05/21/2015  . Irregular heart beat 03/26/2015  . Abdominal pain 08/05/2014  . Allergic rhinitis 08/05/2014  . Routine general medical examination at a health care facility 08/05/2014  . Gastro-esophageal reflux disease without esophagitis 08/05/2014  . Genital herpes simplex type 2 08/05/2014  . Headache, variant migraine 08/05/2014  . Hypertension goal BP (blood pressure) < 140/90 08/05/2014  . Asthma, mild intermittent 08/05/2014  . Back pain, chronic 11/30/2006    Past Surgical History:  Procedure Laterality Date  . CESAREAN SECTION  1991     OB History    Gravida  5   Para  3   Term      Preterm      AB      Living        SAB      TAB      Ectopic      Multiple      Live Births           Obstetric Comments  1st Menstrual Cycle:  13  1st Pregnancy:  22          Home Medications    Prior to Admission medications   Medication Sig Start  Date End Date Taking? Authorizing Provider  acyclovir (ZOVIRAX) 400 MG tablet TAKE 1 TABLET BY MOUTH TWICE A DAY 03/10/17  Yes Lada, Janit Bern, MD  albuterol (PROAIR HFA) 108 (90 Base) MCG/ACT inhaler Inhale 1-2 puffs into the lungs every 4 (four) hours as needed for wheezing. 07/14/17  Yes Lada, Janit Bern, MD  aspirin EC 81 MG tablet Take 1 tablet (81 mg total) by mouth daily. 08/11/16  Yes Lada, Janit Bern, MD  fluticasone (FLONASE) 50 MCG/ACT nasal spray Place 2 sprays into both nostrils daily as needed for allergies. 07/14/17  Yes Lada, Janit Bern, MD  furosemide (LASIX) 20 MG tablet Take 1 tablet (20 mg total) by mouth daily as needed. 07/21/15  Yes Gollan, Tollie Pizza,  MD  lisinopril-hydrochlorothiazide (PRINZIDE,ZESTORETIC) 20-25 MG tablet Take 1 tablet by mouth daily. Labs are needed as well as appointment 09/16/17  Yes Lada, Janit Bern, MD  potassium chloride (K-DUR) 10 MEQ tablet Take 1 tablet (10 mEq total) by mouth daily as needed. 07/21/15  Yes Antonieta Iba, MD  meclizine (ANTIVERT) 25 MG tablet Take 1 tablet (25 mg total) by mouth 3 (three) times daily as needed for dizziness. 09/20/17   Branda Chaudhary A, PA-C  ondansetron (ZOFRAN ODT) 4 MG disintegrating tablet Take 1 tablet (4 mg total) by mouth every 8 (eight) hours as needed for nausea or vomiting. 09/20/17   Kainon Varady A, PA-C    Family History Family History  Problem Relation Age of Onset  . Heart disease Brother   . Heart disease Maternal Aunt   . Diabetes Mother   . Kidney disease Mother        died at 61 yo from kidney failure  . Diabetes Maternal Aunt   . Diabetes Maternal Uncle   . Diabetes Maternal Grandmother   . Breast cancer Maternal Grandmother   . Breast cancer Maternal Aunt   . Colon cancer Paternal Grandfather     Social History Social History   Tobacco Use  . Smoking status: Never Smoker  . Smokeless tobacco: Never Used  Substance Use Topics  . Alcohol use: No    Alcohol/week: 0.0 oz  . Drug use: No     Allergies   Patient has no known allergies.   Review of Systems Review of Systems  Constitutional: Negative for activity change, chills and fever.  HENT: Negative for congestion and sore throat.   Eyes: Negative for visual disturbance.  Respiratory: Negative for shortness of breath.   Cardiovascular: Negative for chest pain.  Gastrointestinal: Positive for nausea (resolved) and vomiting (resolved). Negative for abdominal pain and diarrhea.  Genitourinary: Negative for dysuria and vaginal pain.  Musculoskeletal: Negative for back pain, myalgias and neck stiffness.  Skin: Negative for rash.  Allergic/Immunologic: Negative for immunocompromised state.    Neurological: Positive for dizziness and headaches. Negative for seizures, syncope and weakness.  Psychiatric/Behavioral: Negative for confusion.   Physical Exam Updated Vital Signs BP (!) 135/96 (BP Location: Right Arm)   Pulse 72   Temp 98.1 F (36.7 C) (Oral)   Resp 16   Ht 5\' 7"  (1.702 m)   Wt 108 kg (238 lb)   LMP 08/22/2017   SpO2 100%   BMI 37.28 kg/m    Physical Exam  Constitutional: She is oriented to person, place, and time. She appears well-developed and well-nourished. No distress.  HENT:  Head: Normocephalic and atraumatic.  Right Ear: External ear normal.  Left Ear: External ear normal.  Eyes: Pupils are equal, round,  and reactive to light. Conjunctivae are normal. Right eye exhibits nystagmus.  Neck: Normal range of motion. Neck supple.  Cardiovascular: Normal rate, regular rhythm, normal heart sounds and intact distal pulses. Exam reveals no gallop and no friction rub.  No murmur heard. Pulmonary/Chest: Effort normal and breath sounds normal. No stridor. No respiratory distress. She has no wheezes. She has no rales. She exhibits no tenderness.  Abdominal: Soft. Bowel sounds are normal. She exhibits no distension and no mass. There is no tenderness. There is no rebound and no guarding. No hernia.  Musculoskeletal: She exhibits no edema or tenderness.  Neurological: She is alert and oriented to person, place, and time.  GCS 15. A&Ox3. Mentation is intact. Cranial nerves 2-12 grossly intact. Finger-to-nose is normal bilterally. Heel to shin is intact bilaterally. 5/5 motor strength of the bilateral upper and lower extremities. Moves all four extremities. Negative Romberg. No Pronator drift. Sensation is intact and equal throughout. Symmetric tandem gait. Normal gait with heel and toe walking.    Skin: Skin is warm and dry. Capillary refill takes less than 2 seconds.  Psychiatric: She has a normal mood and affect.  Nursing note and vitals reviewed.    ED  Treatments / Results  Labs (all labs ordered are listed, but only abnormal results are displayed) Labs Reviewed  BASIC METABOLIC PANEL - Abnormal; Notable for the following components:      Result Value   Glucose, Bld 131 (*)    All other components within normal limits  CBC - Abnormal; Notable for the following components:   MCH 25.7 (*)    All other components within normal limits  CBG MONITORING, ED    EKG None  Radiology No results found.  Procedures Procedures (including critical care time)  Medications Ordered in ED Medications  meclizine (ANTIVERT) tablet 25 mg (25 mg Oral Given 09/20/17 1738)     Initial Impression / Assessment and Plan / ED Course  I have reviewed the triage vital signs and the nursing notes.  Pertinent labs & imaging results that were available during my care of the patient were reviewed by me and considered in my medical decision making (see chart for details).     52 year old with a history of migraine variant headaches, HTN, GERD, and asthma who presents to the emergency department with a chief complaint of dizziness.Patient with unilateral nystagmus and normal neurologic exam. Patient normal finger-nose and normal gait.  No slurred speech renal or weakness.  Doubt CVA or other central cause of vertigo.  History and physical consistent with peripheral vertigo symptoms.  She was given meclizine in the ED with significant improvement in her dizziness. Will discharge home with meclizine. Patient instructed to follow up with her primary care physician within 3 days for further evaluation. They are to return to the emergency department for new neurologic symptoms, loss of vision or other concerning symptoms.  Final Clinical Impressions(s) / ED Diagnoses   Final diagnoses:  Vertigo    ED Discharge Orders        Ordered    meclizine (ANTIVERT) 25 MG tablet  3 times daily PRN     09/20/17 1824    ondansetron (ZOFRAN ODT) 4 MG disintegrating tablet   Every 8 hours PRN     09/20/17 1824      Zaccheus Edmister A, PA-C 09/20/17 2251  LongArlyss Repress, MD 09/21/17 [phone]

## 2017-09-20 NOTE — ED Notes (Signed)
Patient verbalizes understanding of discharge instructions. Opportunity for questioning and answers were provided. Armband removed by staff, pt discharged from ED ambulatory.   

## 2017-09-20 NOTE — ED Notes (Signed)
Pt ambulatory to hallway bed, wishes to wait in chair instead of stretcher.

## 2017-09-20 NOTE — ED Notes (Signed)
Mia, PA at bedside.  

## 2017-10-06 ENCOUNTER — Encounter: Payer: Self-pay | Admitting: Family Medicine

## 2017-10-06 ENCOUNTER — Encounter

## 2017-10-06 NOTE — Telephone Encounter (Signed)
Called pt back told her to call back in a couple of days for any cancellations and I would tell the girls up front to right her name down and if they see any cancellations to call her.  I told her we have reached out to several times to make an appt with no response.  No more refills will be given per Dr. Sherie DonLada until she is seen and has labs down.  Pt states her bp has been running high and she has had headache.  I told her she she felt bad or experienced real high bp to go to the ER.

## 2017-10-06 NOTE — Telephone Encounter (Signed)
Pt is calling back and states that she needs her medication and the earliest appointment is 10/31/17.pt wanted to know If you could work her in earlier so that she can receive medication. Please advise Cb#[phone].

## 2017-10-31 ENCOUNTER — Encounter: Payer: Self-pay | Admitting: Family Medicine

## 2017-12-05 ENCOUNTER — Telehealth: Payer: Self-pay | Admitting: Family Medicine

## 2017-12-05 NOTE — Telephone Encounter (Signed)
Left detailed voicemail

## 2017-12-05 NOTE — Telephone Encounter (Signed)
Please remind patient to have her labs done We had hoped to get these in June I've extended them so please ask her to get these in the next few weeks

## 2017-12-29 ENCOUNTER — Other Ambulatory Visit: Payer: Self-pay | Admitting: Family Medicine

## 2017-12-30 NOTE — Telephone Encounter (Signed)
Patient has not been seen since May 2018 Appointment needed

## 2018-01-02 NOTE — Telephone Encounter (Signed)
Called [phone] @ 8:38 informing pt that a prescription was denied and Dr Sherie DonLada is asking pt to schedule appt

## 2018-02-09 ENCOUNTER — Telehealth: Payer: Self-pay | Admitting: Family Medicine

## 2018-02-10 NOTE — Telephone Encounter (Signed)
Needs follow up with Dr. Lada 

## 2018-02-13 NOTE — Telephone Encounter (Signed)
Called [phone] @ 10:14 was not able to leave message. Prescription has been sent to pharmacy however pt is needing to schedule an appt with Dr Sherie Don in order to receive additional refills. A 30 day supply has been sent.

## 2018-03-15 ENCOUNTER — Other Ambulatory Visit: Payer: Self-pay | Admitting: Family Medicine

## 2018-03-21 ENCOUNTER — Other Ambulatory Visit: Payer: Self-pay | Admitting: Family Medicine

## 2018-04-03 ENCOUNTER — Telehealth: Payer: Self-pay | Admitting: Family Medicine

## 2018-04-03 NOTE — Telephone Encounter (Signed)
Please schedule patient for an appointment for her prediabetes, first available; she did not come in for her last appt and her labs are overdue Thank you

## 2018-04-07 NOTE — Telephone Encounter (Signed)
Please contact patient Let her know we would like her to schedule an appointment ASAP She is overdue for labs We need to make sure her prediabetes isn't progressing to diabetes

## 2018-04-10 NOTE — Telephone Encounter (Signed)
Called [phone] and a guy answered stating he just got this number 3 weeks ago (wrong number). Also tried calling [phone] and the lady said there was no one there by this name. Since I have exhausted all means of contacting her via phone, I have sent her a letter.

## 2018-04-19 ENCOUNTER — Telehealth: Payer: Self-pay | Admitting: Family Medicine

## 2018-04-19 NOTE — Telephone Encounter (Signed)
  Patient has not been seen since May 2018  NO MORE MEDICINE until patient is seen for a visit  I am canceling old expired orders

## 2018-05-29 ENCOUNTER — Other Ambulatory Visit: Payer: Self-pay | Admitting: Family Medicine

## 2018-05-29 DIAGNOSIS — R921 Mammographic calcification found on diagnostic imaging of breast: Secondary | ICD-10-CM

## 2018-06-12 ENCOUNTER — Ambulatory Visit: Payer: Self-pay

## 2020-11-17 ENCOUNTER — Emergency Department (HOSPITAL_COMMUNITY)
Admission: EM | Admit: 2020-11-17 | Discharge: 2020-11-17 | Disposition: A | Discharge status: Home / Self Care | Payer: Managed Care, Other (non HMO) | Attending: Emergency Medicine | Admitting: Emergency Medicine

## 2020-11-17 ENCOUNTER — Emergency Department (HOSPITAL_COMMUNITY): Payer: Managed Care, Other (non HMO)

## 2020-11-17 ENCOUNTER — Other Ambulatory Visit: Payer: Self-pay

## 2020-11-17 DIAGNOSIS — Z7982 Long term (current) use of aspirin: Secondary | ICD-10-CM | POA: Diagnosis not present

## 2020-11-17 DIAGNOSIS — N9489 Other specified conditions associated with female genital organs and menstrual cycle: Secondary | ICD-10-CM | POA: Insufficient documentation

## 2020-11-17 DIAGNOSIS — Z79899 Other long term (current) drug therapy: Secondary | ICD-10-CM | POA: Diagnosis not present

## 2020-11-17 DIAGNOSIS — R079 Chest pain, unspecified: Secondary | ICD-10-CM | POA: Insufficient documentation

## 2020-11-17 DIAGNOSIS — J4520 Mild intermittent asthma, uncomplicated: Secondary | ICD-10-CM | POA: Insufficient documentation

## 2020-11-17 DIAGNOSIS — I10 Essential (primary) hypertension: Secondary | ICD-10-CM | POA: Diagnosis not present

## 2020-11-17 LAB — BASIC METABOLIC PANEL
Anion gap: 6 (ref 5–15)
BUN: 16 mg/dL (ref 6–20)
CO2: 27 mmol/L (ref 22–32)
Calcium: 9 mg/dL (ref 8.9–10.3)
Chloride: 104 mmol/L (ref 98–111)
Creatinine, Ser: 0.81 mg/dL (ref 0.44–1.00)
GFR, Estimated: >60 mL/min (ref >60)
Glucose, Bld: 128 mg/dL — ABNORMAL HIGH (ref 70–99)
Potassium: 4.5 mmol/L (ref 3.5–5.1)
Sodium: 137 mmol/L (ref 135–145)

## 2020-11-17 LAB — CBC
HCT: 40.1 % (ref 36.0–46.0)
Hemoglobin: 12.8 g/dL (ref 12.0–15.0)
MCH: 26.1 pg (ref 26.0–34.0)
MCHC: 31.9 g/dL (ref 30.0–36.0)
MCV: 81.8 fL (ref 80.0–100.0)
Platelets: 248 10*3/uL (ref 150–400)
RBC: 4.9 MIL/uL (ref 3.87–5.11)
RDW: 13.9 % (ref 11.5–15.5)
WBC: 5 10*3/uL (ref 4.0–10.5)
nRBC: 0 % (ref 0.0–0.2)

## 2020-11-17 LAB — TROPONIN I (HIGH SENSITIVITY)
Troponin I (High Sensitivity): 4 ng/L (ref <18)
Troponin I (High Sensitivity): 4 ng/L (ref <18)

## 2020-11-17 LAB — D-DIMER, QUANTITATIVE: D-Dimer, Quant: 0.34 ug/mL-FEU (ref 0.00–0.50)

## 2020-11-17 NOTE — ED Triage Notes (Signed)
Pt here POV with complaints of pain starting in right shoulder and radiates to chest. Symptoms began today. Lung sounds clear. 8/10, stabbing, shooting pain. Pt denies SOB

## 2020-11-17 NOTE — ED Provider Notes (Signed)
Emergency Medicine Provider Triage Evaluation Note  Paula Yang , a 55 y.o. female  was evaluated in triage.  Pt complains of left back pain radiating underneath left breast and into LUE that began this morning on her way to work. States she was slightly sweaty when the pain came on. No hx of same. Reports positive Fhx of CAD/MI as young as 70 (father who passed away). No nausea or vomiting. No SOB. Did not take anything for pain PTA.   Review of Systems  Positive: + chest pain, back pain, Negative: - nausea, vomiting, SOB  Physical Exam  BP (!) 160/97   Pulse 78   Temp 98.7 F (37.1 C) (Oral)   Resp 14   Ht 5\' 7"  (1.702 m)   Wt 111.1 kg   SpO2 100%   BMI 38.37 kg/m  Gen:   Awake, no distress   Resp:  Normal effort  MSK:   Moves extremities without difficulty  Other:  Equal pulses,   Medical Decision Making  Medically screening exam initiated at 10:12 AM.  Appropriate orders placed.  LISSIE HINESLEY was informed that the remainder of the evaluation will be completed by another provider, this initial triage assessment does not replace that evaluation, and the importance of remaining in the ED until their evaluation is complete.  ACS workup started   Hoover Brunette, Tanda Rockers 11/17/20 1014    01/17/21, MD 11/17/20 [phone]

## 2020-11-17 NOTE — Discharge Instructions (Addendum)
Your workup was overall reassuring in the ED today  Please follow up with your cardiologist regarding ED visit today  Return to the ED IMMEDIATELY for any new/worsening symptoms including worsening pain, worsening shortness of breath, passing out, nausea/vomiting with the chest pain, or any other concerning symptoms

## 2020-11-17 NOTE — ED Provider Notes (Signed)
[facility] Provider Note   CSN: [id number] Arrival date & time: 11/17/20  0945     History Chief Complaint  Patient presents with   Chest Pain    Paula Yang is a 55 y.o. female with Pmhx HTN and GERD who presents to the ED today with complaint of gradual onset, constant, waxing and waning, left back pain radiating around let breast that began earlier this morning on her way to work. States she was slightly sweaty when the pain came on. No hx of same. Reports positive Fhx of CAD/MI as young as 6149 (father who passed away). No nausea or vomiting. No SOB. Did not take anything for pain PTA.   The history is provided by the patient and medical records.      Past Medical History:  Diagnosis Date   Chronic lumbosacral pain    Gastroesophageal reflux disease without esophagitis    Genital herpes simplex type 2    Headache, variant migraine    HTN, goal below 140/90    Mild intermittent asthma in adult without complication     Patient Active Problem List   Diagnosis Date Noted   Prediabetes 09/16/2017   Microcytosis 09/16/2017   Elevated glucose 12/16/2015   Encounter for medication monitoring 12/16/2015   Abnormal mammography 09/26/2015   Toenail deformity 09/04/2015   Annual physical exam 06/03/2015   Encounter for cholesteral screening for cardiovascular disease 06/03/2015   Encounter for screening mammogram for malignant neoplasm of breast 06/03/2015   Encounter for screening for malignant neoplasm of cervix 06/03/2015   Breast mass, left 06/03/2015   Left knee pain 06/03/2015   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   Shortness of breath 05/21/2015   Morbid obesity (HCC) 05/21/2015   Irregular heart beat 03/26/2015   Abdominal pain 08/05/2014   Allergic rhinitis 08/05/2014   Routine general medical examination at a health care facility 08/05/2014   Gastro-esophageal reflux disease without esophagitis 08/05/2014   Genital herpes simplex  type 2 08/05/2014   Headache, variant migraine 08/05/2014   Hypertension goal BP (blood pressure) < 140/90 08/05/2014   Asthma, mild intermittent 08/05/2014   Back pain, chronic 11/30/2006    Past Surgical History:  Procedure Laterality Date   CESAREAN SECTION  1991     OB History     Gravida  5   Para  3   Term      Preterm      AB      Living         SAB      IAB      Ectopic      Multiple      Live Births           Obstetric Comments  1st Menstrual Cycle:  13  1st Pregnancy:  22          Family History  Problem Relation Age of Onset   Heart disease Brother    Heart disease Maternal Aunt    Diabetes Mother    Kidney disease Mother        died at 54 yo from kidney failure   Diabetes Maternal Aunt    Diabetes Maternal Uncle    Diabetes Maternal Grandmother    Breast cancer Maternal Grandmother    Breast cancer Maternal Aunt    Colon cancer Paternal Grandfather     Social History   Tobacco Use   Smoking status: Never   Smokeless tobacco: Never  Substance Use  Topics   Alcohol use: No    Alcohol/week: 0.0 standard drinks   Drug use: No    Home Medications Prior to Admission medications   Medication Sig Start Date End Date Taking? Authorizing Provider  albuterol (PROAIR HFA) 108 (90 Base) MCG/ACT inhaler Inhale 1-2 puffs into the lungs every 4 (four) hours as needed for wheezing. 07/14/17  Yes Lada, Janit Bern, MD  furosemide (LASIX) 20 MG tablet Take 1 tablet (20 mg total) by mouth daily as needed. Patient taking differently: Take 20 mg by mouth daily as needed for fluid. 07/21/15  Yes Gollan, Tollie Pizza, MD  lisinopril-hydrochlorothiazide (PRINZIDE,ZESTORETIC) 20-25 MG tablet Take 1 tablet by mouth daily. Labs are needed as well as appointment Patient taking differently: Take 1 tablet by mouth daily. 09/16/17  Yes Lada, Janit Bern, MD  loratadine (CLARITIN) 10 MG tablet Take 10 mg by mouth daily.   Yes [provider]  meclizine  (ANTIVERT) 25 MG tablet Take 1 tablet (25 mg total) by mouth 3 (three) times daily as needed for dizziness. 09/20/17  Yes McDonald, Mia A, PA-C  metoprolol succinate (TOPROL-XL) 25 MG 24 hr tablet Take 12.5 mg by mouth daily. 07/17/19  Yes [provider]  omeprazole (PRILOSEC) 40 MG capsule Take 40 mg by mouth daily. 10/25/20  Yes [provider]  acyclovir (ZOVIRAX) 400 MG tablet TAKE 1 TABLET BY MOUTH TWICE A DAY Patient not taking: No sig reported 02/10/18   Doren Custard, FNP  aspirin EC 81 MG tablet Take 1 tablet (81 mg total) by mouth daily. Patient not taking: No sig reported 08/11/16   Kerman Passey, MD  fluticasone (FLONASE) 50 MCG/ACT nasal spray Place 2 sprays into both nostrils daily as needed for allergies. Patient not taking: No sig reported 07/14/17   Kerman Passey, MD  ondansetron (ZOFRAN ODT) 4 MG disintegrating tablet Take 1 tablet (4 mg total) by mouth every 8 (eight) hours as needed for nausea or vomiting. Patient not taking: No sig reported 09/20/17   McDonald, Mia A, PA-C  potassium chloride (K-DUR) 10 MEQ tablet Take 1 tablet (10 mEq total) by mouth daily as needed. Patient not taking: No sig reported 07/21/15   Antonieta Iba, MD    Allergies    Patient has no known allergies.  Review of Systems   Review of Systems  Constitutional:  Negative for chills and fever.  Respiratory:  Positive for shortness of breath. Negative for cough.   Cardiovascular:  Positive for chest pain.  All other systems reviewed and are negative.  Physical Exam Updated Vital Signs BP (!) 148/81   Pulse 66   Temp 98.7 F (37.1 C) (Oral)   Resp 18   Ht 5\' 7"  (1.702 m)   Wt 111.1 kg   SpO2 100%   BMI 38.37 kg/m   Physical Exam Vitals and nursing note reviewed.  Constitutional:      Appearance: She is not ill-appearing or diaphoretic.  HENT:     Head: Normocephalic and atraumatic.  Eyes:     Conjunctiva/sclera: Conjunctivae normal.  Cardiovascular:     Rate and  Rhythm: Normal rate and regular rhythm.     Pulses:          Radial pulses are 2+ on the right side and 2+ on the left side.       Dorsalis pedis pulses are 2+ on the right side and 2+ on the left side.     Heart sounds: Normal heart sounds.  Pulmonary:     Effort: Pulmonary effort is normal.     Breath sounds: Normal breath sounds. No decreased breath sounds, wheezing, rhonchi or rales.  Abdominal:     Palpations: Abdomen is soft.     Tenderness: There is no abdominal tenderness. There is no guarding.  Musculoskeletal:     Cervical back: Neck supple.     Right lower leg: No tenderness. No edema.     Left lower leg: No tenderness. No edema.  Skin:    General: Skin is warm and dry.  Neurological:     Mental Status: She is alert.    ED Results / Procedures / Treatments   Labs (all labs ordered are listed, but only abnormal results are displayed) Labs Reviewed  BASIC METABOLIC PANEL - Abnormal; Notable for the following components:      Result Value   Glucose, Bld 128 (*)    All other components within normal limits  CBC  D-DIMER, QUANTITATIVE  I-STAT BETA HCG BLOOD, ED ([facility], [facility], [facility] ONLY)  TROPONIN I (HIGH SENSITIVITY)  TROPONIN I (HIGH SENSITIVITY)    EKG None  Radiology DG Chest 2 View  Result Date: 11/17/2020 CLINICAL DATA:  Chest pain EXAM: CHEST - 2 VIEW COMPARISON:  None. FINDINGS: Cardiac contours upper limits of normal in size. Both lungs are clear. The visualized skeletal structures are unremarkable. IMPRESSION: No active cardiopulmonary disease. Electronically Signed   By: Allegra Lai MD   On: 11/17/2020 10:41    Procedures Procedures   Medications Ordered in ED Medications - No data to display  ED Course  I have reviewed the triage vital signs and the nursing notes.  Pertinent labs & imaging results that were available during my care of the patient were reviewed by me and considered in my medical decision making (see chart for details).    MDM  Rules/Calculators/A&P                           55 year old female who presents to the ED today with complaint of left back pain radiating to left chest began earlier this morning.  On arrival to the ED vitals are stable patient appears to be no acute distress.  She was medically screened by myself and her work-up started in the triage area including EKG did show some nonspecific T wave abnormalities, no acute ischemic changes today.  Chest x-ray clear. Workup overall reassuring at this time with 2 negative troponins. Will add on d dimer at this time.  If negative we will plan to discharge home with cardiology/PCP follow-up.  Dimer negative at 0.34.  While patient is in the room she did have 3 beats of V. tach.  It is running PVCs which she has a history of same.  Discussed case with attending physician Dr. Durwin Nora, patient's heart score is 5.  Have offered medical observation for patient however states she would like to go home.  We will plan for her to follow-up with cardiology for same.  Strict return precautions have been discussed.  She is in agreement with plan and stable for discharge.  This note was prepared using Dragon voice recognition software and may include unintentional dictation errors due to the inherent limitations of voice recognition software.   Final Clinical Impression(s) / ED Diagnoses Final diagnoses:  Nonspecific chest pain    Rx / DC Orders ED Discharge Orders     None  Discharge Instructions      Your workup was overall reassuring in the ED today  Please follow up with your cardiologist regarding ED visit today  Return to the ED IMMEDIATELY for any new/worsening symptoms including worsening pain, worsening shortness of breath, passing out, nausea/vomiting with the chest pain, or any other concerning symptoms       Tanda Rockers, PA-C 11/17/20 1854    Gloris Manchester, MD 11/17/20 2000

## 2020-11-17 NOTE — ED Notes (Signed)
Rhythm strip given to MD.  Pt had a run of 3-4 beats of vtach and irregular with frequent PAC/s

## 2020-11-17 NOTE — ED Notes (Signed)
Pts visitor was standing by the door with the door open intermittently looking out a the nursing station.   I asked him was he okay, asked if he needed anything.  The visitor repeatedly stated what and asking what was I asking him for.  I then went into the room thinking maybe he wasn't understanding what I was asking or just couldn't hear me it was very loud at the nursing station and I was just asking if he was ok when he began yelling at me saying I was "harassing him and why couldn't he just be standing there watching TV why did I have to harass him."  I did not see he was standing at the door watching TV, because he had looked out multiple times. I explained to him again after he yelled at me again, that I was simply just making sure he was ok and he didn't need anything.

## 2020-11-17 NOTE — ED Notes (Signed)
BSC placed at bedside.

## 2021-05-21 ENCOUNTER — Encounter (HOSPITAL_COMMUNITY): Payer: Self-pay | Admitting: Emergency Medicine

## 2021-05-21 ENCOUNTER — Other Ambulatory Visit: Payer: Self-pay

## 2021-05-21 ENCOUNTER — Emergency Department (HOSPITAL_COMMUNITY): Payer: Self-pay

## 2021-05-21 ENCOUNTER — Emergency Department (HOSPITAL_COMMUNITY)
Admission: EM | Admit: 2021-05-21 | Discharge: 2021-05-22 | Disposition: A | Discharge status: Home / Self Care | Payer: Self-pay | Attending: Emergency Medicine | Admitting: Emergency Medicine

## 2021-05-21 DIAGNOSIS — R0789 Other chest pain: Secondary | ICD-10-CM | POA: Insufficient documentation

## 2021-05-21 DIAGNOSIS — Y9389 Activity, other specified: Secondary | ICD-10-CM | POA: Insufficient documentation

## 2021-05-21 DIAGNOSIS — J45909 Unspecified asthma, uncomplicated: Secondary | ICD-10-CM | POA: Insufficient documentation

## 2021-05-21 DIAGNOSIS — Z79899 Other long term (current) drug therapy: Secondary | ICD-10-CM | POA: Insufficient documentation

## 2021-05-21 DIAGNOSIS — W010XXA Fall on same level from slipping, tripping and stumbling without subsequent striking against object, initial encounter: Secondary | ICD-10-CM | POA: Insufficient documentation

## 2021-05-21 DIAGNOSIS — R079 Chest pain, unspecified: Secondary | ICD-10-CM

## 2021-05-21 DIAGNOSIS — I10 Essential (primary) hypertension: Secondary | ICD-10-CM | POA: Insufficient documentation

## 2021-05-21 DIAGNOSIS — Z7982 Long term (current) use of aspirin: Secondary | ICD-10-CM | POA: Insufficient documentation

## 2021-05-21 DIAGNOSIS — Y990 Civilian activity done for income or pay: Secondary | ICD-10-CM | POA: Insufficient documentation

## 2021-05-21 LAB — BASIC METABOLIC PANEL
Anion gap: 8 (ref 5–15)
BUN: 10 mg/dL (ref 6–20)
CO2: 27 mmol/L (ref 22–32)
Calcium: 9.2 mg/dL (ref 8.9–10.3)
Chloride: 102 mmol/L (ref 98–111)
Creatinine, Ser: 0.72 mg/dL (ref 0.44–1.00)
GFR, Estimated: >60 mL/min (ref >60)
Glucose, Bld: 126 mg/dL — ABNORMAL HIGH (ref 70–99)
Potassium: 3.7 mmol/L (ref 3.5–5.1)
Sodium: 137 mmol/L (ref 135–145)

## 2021-05-21 LAB — CBC WITH DIFFERENTIAL/PLATELET
Abs Immature Granulocytes: 0.02 10*3/uL (ref 0.00–0.07)
Basophils Absolute: 0 10*3/uL (ref 0.0–0.1)
Basophils Relative: 1 %
Eosinophils Absolute: 0.1 10*3/uL (ref 0.0–0.5)
Eosinophils Relative: 2 %
HCT: 38.9 % (ref 36.0–46.0)
Hemoglobin: 12.6 g/dL (ref 12.0–15.0)
Immature Granulocytes: 0 %
Lymphocytes Relative: 36 %
Lymphs Abs: 1.9 10*3/uL (ref 0.7–4.0)
MCH: 26.4 pg (ref 26.0–34.0)
MCHC: 32.4 g/dL (ref 30.0–36.0)
MCV: 81.6 fL (ref 80.0–100.0)
Monocytes Absolute: 0.4 10*3/uL (ref 0.1–1.0)
Monocytes Relative: 8 %
Neutro Abs: 2.7 10*3/uL (ref 1.7–7.7)
Neutrophils Relative %: 53 %
Platelets: 258 10*3/uL (ref 150–400)
RBC: 4.77 MIL/uL (ref 3.87–5.11)
RDW: 13.8 % (ref 11.5–15.5)
WBC: 5.2 10*3/uL (ref 4.0–10.5)
nRBC: 0 % (ref 0.0–0.2)

## 2021-05-21 LAB — MAGNESIUM: Magnesium: 1.8 mg/dL (ref 1.7–2.4)

## 2021-05-21 LAB — BRAIN NATRIURETIC PEPTIDE: B Natriuretic Peptide: 21.5 pg/mL (ref 0.0–100.0)

## 2021-05-21 LAB — TROPONIN I (HIGH SENSITIVITY)
Troponin I (High Sensitivity): 4 ng/L (ref <18)
Troponin I (High Sensitivity): 4 ng/L (ref <18)

## 2021-05-21 LAB — D-DIMER, QUANTITATIVE: D-Dimer, Quant: 0.52 ug/mL-FEU — ABNORMAL HIGH (ref 0.00–0.50)

## 2021-05-21 NOTE — ED Provider Triage Note (Signed)
Emergency Medicine Provider Triage Evaluation Note  Paula Yang , a 56 y.o. female  was evaluated in triage.  Pt complains of chest pain that began at 1:30 AM Wednesday morning.  The patient states that she woke up with chest pressure and shortness of breath.  She also felt pain in her left arm.  The pain is described as pressure and the patient rates it as 7 out of 10.  The patient also complains of new onset shortness of breath which is worse when lying down..  Patient states that the pressure is worse at night.  The pain has been waxing and waning since the beginning.  This patient had a similar episode in August of this year and was discharged with outpatient cardiology follow-up.  Cardiology monitor with a Holter monitor which showed over 1000 PVCs during the study.  The patient was post to continue to follow-up with a cardiologist but her insurance change prevented this.  She has continued to take her metoprolol.  The patient also complains of mild nausea and increased swelling in extremities.  Review of Systems  Positive: Chest pressure, nausea, shortness of breath, orthopnea Negative: Back pain  Physical Exam  There were no vitals taken for this visit. Gen:   Awake, no distress   Resp:  Normal effort , lungs clear to auscultation bilaterally MSK:   Moves extremities without difficulty, mild swelling noted at bilateral ankles. Other:  Heart sounds regular, no murmur noted  Medical Decision Making  Medically screening exam initiated at 7:03 PM.  Appropriate orders placed.  Paula Yang was informed that the remainder of the evaluation will be completed by another provider, this initial triage assessment does not replace that evaluation, and the importance of remaining in the ED until their evaluation is complete.     Darrick Grinder, [place] 05/21/21 1907

## 2021-05-21 NOTE — ED Triage Notes (Signed)
Pt reported to ED with c/o pain to left chest that radiates to arm and neck since this past Tuesday. Pt endorses feelings of nausea and SOB.

## 2021-05-22 MED ORDER — IBUPROFEN 800 MG PO TABS
800.0000 mg | ORAL_TABLET | Freq: Once | ORAL | Status: AC
  Administered 2021-05-22: 800 mg via ORAL
  Filled 2021-05-22: qty 1

## 2021-05-22 NOTE — ED Notes (Signed)
Pt verbalized understanding of d/c instructions, meds, and followup care. Denies questions. VSS, no distress noted. Steady gait to exit with all belongings.  ?

## 2021-05-22 NOTE — ED Provider Notes (Signed)
[facility] EMERGENCY DEPARTMENT Provider Note   CSN: [id number] Arrival date & time: 05/21/21  1744     History  Chief Complaint  Patient presents with   Chest Pain    Paula Yang is a 56 y.o. female.  Patient is a 56 year old female with past medical history of hypertension, PVCs, asthma.  Patient presenting today for evaluation of chest discomfort.  This started 3 days ago.  She describes a pain to her upper sternum and left upper chest that is worse when she moves or palpates the area.  She denies feeling short of breath, but does report discomfort when she breathes.  She denies fevers, chills, or cough.  She tells me she was lifting a client at work when that person slipped and she caught them before they fell.  She believes she may have injured herself.  She reports having had a heart cath in 2016 at an outside facility which was unremarkable.  She did have a stress test here 3 years ago which was negative.  The history is provided by the patient.      Home Medications Prior to Admission medications   Medication Sig Start Date End Date Taking? Authorizing Provider  acyclovir (ZOVIRAX) 400 MG tablet TAKE 1 TABLET BY MOUTH TWICE A DAY Patient not taking: No sig reported 02/10/18   Hubbard Hartshorn, FNP  albuterol (PROAIR HFA) 108 (90 Base) MCG/ACT inhaler Inhale 1-2 puffs into the lungs every 4 (four) hours as needed for wheezing. 07/14/17   Arnetha Courser, MD  aspirin EC 81 MG tablet Take 1 tablet (81 mg total) by mouth daily. Patient not taking: No sig reported 08/11/16   Arnetha Courser, MD  fluticasone (FLONASE) 50 MCG/ACT nasal spray Place 2 sprays into both nostrils daily as needed for allergies. Patient not taking: No sig reported 07/14/17   Arnetha Courser, MD  furosemide (LASIX) 20 MG tablet Take 1 tablet (20 mg total) by mouth daily as needed. Patient taking differently: Take 20 mg by mouth daily as needed for fluid. 07/21/15   Minna Merritts, MD   lisinopril-hydrochlorothiazide (PRINZIDE,ZESTORETIC) 20-25 MG tablet Take 1 tablet by mouth daily. Labs are needed as well as appointment Patient taking differently: Take 1 tablet by mouth daily. 09/16/17   Arnetha Courser, MD  loratadine (CLARITIN) 10 MG tablet Take 10 mg by mouth daily.    [provider]  meclizine (ANTIVERT) 25 MG tablet Take 1 tablet (25 mg total) by mouth 3 (three) times daily as needed for dizziness. 09/20/17   McDonald, Mia A, PA-C  metoprolol succinate (TOPROL-XL) 25 MG 24 hr tablet Take 12.5 mg by mouth daily. 07/17/19   [provider]  omeprazole (PRILOSEC) 40 MG capsule Take 40 mg by mouth daily. 10/25/20   [provider]  ondansetron (ZOFRAN ODT) 4 MG disintegrating tablet Take 1 tablet (4 mg total) by mouth every 8 (eight) hours as needed for nausea or vomiting. Patient not taking: No sig reported 09/20/17   McDonald, Mia A, PA-C  potassium chloride (K-DUR) 10 MEQ tablet Take 1 tablet (10 mEq total) by mouth daily as needed. Patient not taking: No sig reported 07/21/15   Minna Merritts, MD      Allergies    Patient has no known allergies.    Review of Systems   Review of Systems  All other systems reviewed and are negative.  Physical Exam Updated Vital Signs BP 132/79    Pulse  80    Temp 98.3 F (36.8 C) (Oral)    Resp 18    Ht 5\' 7"  (1.702 m)    Wt 108.9 kg    SpO2 100%    BMI 37.59 kg/m  Physical Exam Vitals and nursing note reviewed.  Constitutional:      General: She is not in acute distress.    Appearance: She is well-developed. She is not diaphoretic.  HENT:     Head: Normocephalic and atraumatic.  Cardiovascular:     Rate and Rhythm: Normal rate and regular rhythm.     Heart sounds: No murmur heard.   No friction rub. No gallop.  Pulmonary:     Effort: Pulmonary effort is normal. No respiratory distress.     Breath sounds: Normal breath sounds. No wheezing.  Abdominal:     General: Bowel sounds are normal. There is  no distension.     Palpations: Abdomen is soft.     Tenderness: There is no abdominal tenderness.  Musculoskeletal:        General: Normal range of motion.     Cervical back: Normal range of motion and neck supple.     Right lower leg: No tenderness. No edema.     Left lower leg: No tenderness. No edema.  Skin:    General: Skin is warm and dry.  Neurological:     General: No focal deficit present.     Mental Status: She is alert and oriented to person, place, and time.    ED Results / Procedures / Treatments   Labs (all labs ordered are listed, but only abnormal results are displayed) Labs Reviewed  BASIC METABOLIC PANEL - Abnormal; Notable for the following components:      Result Value   Glucose, Bld 126 (*)    All other components within normal limits  D-DIMER, QUANTITATIVE - Abnormal; Notable for the following components:   D-Dimer, Quant 0.52 (*)    All other components within normal limits  CBC WITH DIFFERENTIAL/PLATELET  BRAIN NATRIURETIC PEPTIDE  MAGNESIUM  CBC  TROPONIN I (HIGH SENSITIVITY)  TROPONIN I (HIGH SENSITIVITY)    EKG EKG Interpretation  Date/Time:  Thursday May 21 2021 18:47:53 EST Ventricular Rate:  81 PR Interval:  170 QRS Duration: 82 QT Interval:  402 QTC Calculation: 466 R Axis:   -4 Text Interpretation: Normal sinus rhythm LVH Nonspecific T wave abnormality Abnormal ECG When compared with ECG of 17-Nov-2020 09:58, No significant changes noted Confirmed by Veryl Speak [id number]) on 05/22/2021 1:42:05 AM  Radiology DG Chest 2 View  Result Date: 05/21/2021 CLINICAL DATA:  Chest pain EXAM: CHEST - 2 VIEW COMPARISON:  11/17/2020 FINDINGS: Lungs are clear.  No pleural effusion or pneumothorax. The heart is normal in size. Visualized osseous structures are within normal limits. IMPRESSION: Normal chest radiographs. Electronically Signed   By: Julian Hy M.D.   On: 05/21/2021 22:01    Procedures Procedures  None  Medications Ordered in  ED Medications - No data to display  ED Course/ Medical Decision Making/ A&P   This patient presents to the ED for concern of chest discomfort, this involves an extensive number of treatment options, and is a complaint that carries with it a high risk of complications and morbidity.  The differential diagnosis includes acute coronary syndrome, pulmonary embolism, aortic dissection, musculoskeletal, pneumothorax   Co morbidities that complicate the patient evaluation  None   Additional history obtained:  No additional history obtained or external records needed  Lab Tests:  I Ordered, and personally interpreted labs.  The pertinent results include: CBC, metabolic panel, and troponin x2.  These were all unremarkable   Imaging Studies ordered:  I ordered imaging studies including chest x-ray I independently visualized and interpreted imaging which showed no acute process I agree with the radiologist interpretation   Cardiac Monitoring:  The patient was maintained on a cardiac monitor.  I personally viewed and interpreted the cardiac monitored which showed an underlying rhythm of: Sinus rhythm   Medicines ordered and prescription drug management:  I ordered medication including Motrin for pain Reevaluation of the patient after these medicines showed that the patient improved I have reviewed the patients home medicines and have made adjustments as needed   Test Considered:  No other test considered   Critical Interventions:  None   Consultations Obtained:  No consultations obtained or indicated, but patient will be referred to cardiology as an outpatient if symptoms do not improve.   Problem List / ED Course:  Patient presenting with complaints of chest discomfort that I highly suspect is musculoskeletal in etiology.  It has been ongoing for 3 days and she has no change in her EKG or troponin x2.  Remainder of the work-up is unremarkable.  Patient to be  discharged with anti-inflammatory medications, and follow-up with cardiology to discuss stress testing.   Reevaluation:  After the interventions noted above, I reevaluated the patient and found that they have : Improved   Social Determinants of Health:  None   Dispostion:  After consideration of the diagnostic results and the patients response to treatment, I feel that the patent would benefit from NSAIDs and outpatient cardiology follow-up.                         Final Clinical Impression(s) / ED Diagnoses Final diagnoses:  None    Rx / DC Orders ED Discharge Orders     None         Veryl Speak, MD 05/22/21 [phone]

## 2021-05-22 NOTE — Discharge Instructions (Addendum)
Take ibuprofen 600 mg 3 times daily for the next 5 days.  Rest.  Follow-up with cardiology if symptoms or not improving in the next few days, and return to the ER if you develop worsening pain, difficulty breathing, or other new and concerning symptoms.

## 2021-12-03 ENCOUNTER — Other Ambulatory Visit: Payer: Self-pay

## 2021-12-03 DIAGNOSIS — Z1231 Encounter for screening mammogram for malignant neoplasm of breast: Secondary | ICD-10-CM

## 2021-12-22 ENCOUNTER — Other Ambulatory Visit: Payer: Self-pay

## 2021-12-22 ENCOUNTER — Encounter (HOSPITAL_COMMUNITY): Payer: Self-pay | Admitting: Emergency Medicine

## 2021-12-22 ENCOUNTER — Emergency Department (HOSPITAL_COMMUNITY)
Admission: EM | Admit: 2021-12-22 | Discharge: 2021-12-22 | Disposition: A | Discharge status: Home / Self Care | Payer: Self-pay | Attending: Emergency Medicine | Admitting: Emergency Medicine

## 2021-12-22 ENCOUNTER — Emergency Department (HOSPITAL_COMMUNITY): Payer: Self-pay

## 2021-12-22 DIAGNOSIS — M79602 Pain in left arm: Secondary | ICD-10-CM | POA: Insufficient documentation

## 2021-12-22 DIAGNOSIS — R002 Palpitations: Secondary | ICD-10-CM | POA: Insufficient documentation

## 2021-12-22 DIAGNOSIS — Z79899 Other long term (current) drug therapy: Secondary | ICD-10-CM | POA: Insufficient documentation

## 2021-12-22 DIAGNOSIS — J45909 Unspecified asthma, uncomplicated: Secondary | ICD-10-CM | POA: Insufficient documentation

## 2021-12-22 DIAGNOSIS — Z7982 Long term (current) use of aspirin: Secondary | ICD-10-CM | POA: Insufficient documentation

## 2021-12-22 DIAGNOSIS — I10 Essential (primary) hypertension: Secondary | ICD-10-CM | POA: Insufficient documentation

## 2021-12-22 LAB — BRAIN NATRIURETIC PEPTIDE: B Natriuretic Peptide: 67.1 pg/mL (ref 0.0–100.0)

## 2021-12-22 LAB — BASIC METABOLIC PANEL
Anion gap: 12 (ref 5–15)
BUN: 10 mg/dL (ref 6–20)
CO2: 22 mmol/L (ref 22–32)
Calcium: 9.4 mg/dL (ref 8.9–10.3)
Chloride: 105 mmol/L (ref 98–111)
Creatinine, Ser: 0.69 mg/dL (ref 0.44–1.00)
GFR, Estimated: >60 mL/min (ref >60)
Glucose, Bld: 108 mg/dL — ABNORMAL HIGH (ref 70–99)
Potassium: 3.9 mmol/L (ref 3.5–5.1)
Sodium: 139 mmol/L (ref 135–145)

## 2021-12-22 LAB — TROPONIN I (HIGH SENSITIVITY)
Troponin I (High Sensitivity): 4 ng/L (ref <18)
Troponin I (High Sensitivity): 5 ng/L (ref <18)

## 2021-12-22 LAB — CBC
HCT: 39.4 % (ref 36.0–46.0)
Hemoglobin: 13 g/dL (ref 12.0–15.0)
MCH: 26.1 pg (ref 26.0–34.0)
MCHC: 33 g/dL (ref 30.0–36.0)
MCV: 79.1 fL — ABNORMAL LOW (ref 80.0–100.0)
Platelets: 262 10*3/uL (ref 150–400)
RBC: 4.98 MIL/uL (ref 3.87–5.11)
RDW: 14.3 % (ref 11.5–15.5)
WBC: 5.4 10*3/uL (ref 4.0–10.5)
nRBC: 0 % (ref 0.0–0.2)

## 2021-12-22 LAB — CBG MONITORING, ED: Glucose-Capillary: 111 mg/dL — ABNORMAL HIGH (ref 70–99)

## 2021-12-22 LAB — I-STAT BETA HCG BLOOD, ED (MC, WL, AP ONLY): I-stat hCG, quantitative: <5 m[IU]/mL (ref <5)

## 2021-12-22 MED ORDER — ASPIRIN 81 MG PO CHEW: 324.0000 mg | CHEWABLE_TABLET | Freq: Once | ORAL | Status: DC

## 2021-12-22 NOTE — ED Provider Notes (Signed)
  Physical Exam  BP (!) 147/95   Pulse 69   Temp 98.4 F (36.9 C) (Oral)   Resp 20   Ht 5\' 7"  (1.702 m)   Wt 105.2 kg   SpO2 98%   BMI 36.34 kg/m   Physical Exam  Procedures  Procedures  ED Course / MDM    Medical Decision Making Amount and/or Complexity of Data Reviewed Labs: ordered. Radiology: ordered.  Risk OTC drugs.   Delta trop negative. Chest pain or palpitations.  Doubt cardiac ischemia or pulm embolism.  Will increase metoprolol if needed.  Outpatient follow-up.  Appears stable for discharge.        , MD 12/22/21 1931

## 2021-12-22 NOTE — Discharge Instructions (Addendum)
You can try taking a whole tab of the metoprolol to see if it helps.  Follow-up with your doctor.

## 2021-12-22 NOTE — ED Provider Notes (Signed)
[facility] EMERGENCY DEPARTMENT Provider Note   CSN: [id number] Arrival date & time: 12/22/21  1335     History {Add pertinent medical, surgical, social history, OB history to HPI:1} Chief Complaint  Patient presents with   Chest Pain    Paula Yang is a 56 y.o. female.  HPI With a history of PVC, hypertension, asthma presents with palpitations, left arm pain.  Onset was 4 days ago.  Since that time she has had multiple periods of more severe symptoms, currently is feeling somewhat better.  She received nitroglycerin in route and EMS reports the vital signs were unremarkable in transport.  Notes no history of ischemic coronary disease.    Home Medications Prior to Admission medications   Medication Sig Start Date End Date Taking? Authorizing Provider  acyclovir (ZOVIRAX) 400 MG tablet TAKE 1 TABLET BY MOUTH TWICE A DAY Patient not taking: No sig reported 02/10/18   Doren Custard, FNP  albuterol (PROAIR HFA) 108 (90 Base) MCG/ACT inhaler Inhale 1-2 puffs into the lungs every 4 (four) hours as needed for wheezing. 07/14/17   Kerman Passey, MD  aspirin EC 81 MG tablet Take 1 tablet (81 mg total) by mouth daily. Patient not taking: No sig reported 08/11/16   Kerman Passey, MD  fluticasone (FLONASE) 50 MCG/ACT nasal spray Place 2 sprays into both nostrils daily as needed for allergies. Patient not taking: No sig reported 07/14/17   Kerman Passey, MD  furosemide (LASIX) 20 MG tablet Take 1 tablet (20 mg total) by mouth daily as needed. Patient taking differently: Take 20 mg by mouth daily as needed for fluid. 07/21/15   Antonieta Iba, MD  lisinopril-hydrochlorothiazide (PRINZIDE,ZESTORETIC) 20-25 MG tablet Take 1 tablet by mouth daily. Labs are needed as well as appointment Patient taking differently: Take 1 tablet by mouth daily. 09/16/17   Kerman Passey, MD  loratadine (CLARITIN) 10 MG tablet Take 10 mg by mouth daily.    [provider]  meclizine  (ANTIVERT) 25 MG tablet Take 1 tablet (25 mg total) by mouth 3 (three) times daily as needed for dizziness. 09/20/17   McDonald, Mia A, PA-C  metoprolol succinate (TOPROL-XL) 25 MG 24 hr tablet Take 12.5 mg by mouth daily. 07/17/19   [provider]  omeprazole (PRILOSEC) 40 MG capsule Take 40 mg by mouth daily. 10/25/20   [provider]  ondansetron (ZOFRAN ODT) 4 MG disintegrating tablet Take 1 tablet (4 mg total) by mouth every 8 (eight) hours as needed for nausea or vomiting. Patient not taking: No sig reported 09/20/17   McDonald, Mia A, PA-C  potassium chloride (K-DUR) 10 MEQ tablet Take 1 tablet (10 mEq total) by mouth daily as needed. Patient not taking: No sig reported 07/21/15   Antonieta Iba, MD      Allergies    Patient has no known allergies.    Review of Systems   Review of Systems  All other systems reviewed and are negative.   Physical Exam Updated Vital Signs BP (!) 145/110   Pulse 67   Temp 98.4 F (36.9 C) (Oral)   Resp 14   Ht 5\' 7"  (1.702 m)   Wt 105.2 kg   SpO2 100%   BMI 36.34 kg/m  Physical Exam Vitals and nursing note reviewed.  Constitutional:      General: She is not in acute distress.    Appearance: She is well-developed. She is obese.  HENT:  Head: Normocephalic and atraumatic.  Eyes:     Conjunctiva/sclera: Conjunctivae normal.  Cardiovascular:     Rate and Rhythm: Normal rate and regular rhythm.  Pulmonary:     Effort: Pulmonary effort is normal. No respiratory distress.     Breath sounds: Normal breath sounds. No stridor.  Abdominal:     General: There is no distension.  Skin:    General: Skin is warm and dry.  Neurological:     Mental Status: She is alert and oriented to person, place, and time.     Cranial Nerves: No cranial nerve deficit.  Psychiatric:        Mood and Affect: Mood normal.     ED Results / Procedures / Treatments   Labs (all labs ordered are listed, but only abnormal results are  displayed) Labs Reviewed  CBC - Abnormal; Notable for the following components:      Result Value   MCV 79.1 (*)    All other components within normal limits  CBG MONITORING, ED - Abnormal; Notable for the following components:   Glucose-Capillary 111 (*)    All other components within normal limits  BASIC METABOLIC PANEL  BRAIN NATRIURETIC PEPTIDE  I-STAT BETA HCG BLOOD, ED ([facility], [facility], [facility] ONLY)  TROPONIN I (HIGH SENSITIVITY)    EKG EKG Interpretation  Date/Time:  Tuesday December 22 2021 13:40:45 EDT Ventricular Rate:  68 PR Interval:  174 QRS Duration: 93 QT Interval:  449 QTC Calculation: 478 R Axis:   21 Text Interpretation: Sinus rhythm Multiform ventricular premature complexes Borderline T wave abnormalities Abnormal ECG Confirmed by Gerhard Munch [phone]) on 12/22/2021 2:44:35 PM  Radiology DG Chest Portable 1 View  Result Date: 12/22/2021 CLINICAL DATA:  Chest pain EXAM: PORTABLE CHEST 1 VIEW COMPARISON:  05/21/2021 FINDINGS: The heart size and mediastinal contours are within normal limits. Both lungs are clear. The visualized skeletal structures are unremarkable. IMPRESSION: No active disease. Electronically Signed   By: Paulina Fusi M.D.   On: 12/22/2021 14:26    Procedures Procedures  {Document cardiac monitor, telemetry assessment procedure when appropriate:1}  Medications Ordered in ED Medications  aspirin chewable tablet 324 mg (324 mg Oral Not Given 12/22/21 1410)    ED Course/ Medical Decision Making/ A&P This patient with a Hx of PVC, obesity, hypertension presents to the ED for concern of episodic chest pain, this involves an extensive number of treatment options, and is a complaint that carries with it a high risk of complications and morbidity.    The differential diagnosis includes ACS, arrhythmia, electrolyte abnormalities, pulmonary etiology hypertensive crisis   Social Determinants of Health:  Obesity  Additional history obtained:  Additional  history and/or information obtained from chart review, notable for ongoing management for medical problems as above,   After the initial evaluation, orders, including: Labs x-ray monitoring were initiated.   Patient placed on Cardiac and Pulse-Oximetry Monitors. The patient was maintained on a cardiac monitor.  The cardiac monitored showed an rhythm of 70 sinus normal occasional PVC The patient was also maintained on pulse oximetry. The readings were typically 100% room air normal   On repeat evaluation of the patient stayed the same  Lab Tests:  I personally interpreted labs.  The pertinent results include: Unremarkable labs, unremarkable troponin nonpregnant  Imaging Studies ordered:  I independently visualized and interpreted imaging which showed unremarkable chest x-ray I agree with the radiologist interpretation  Dispostion / Final MDM:  After consideration of the diagnostic results and the patient's  response to treatment, female with history of PVC, obesity, hypertension presents with worsening palpitations, lightheadedness without complete syncope without substantial pain, without distress.  Patient is hemodynamically unremarkable, has no focal weakness, low suspicion for stroke, no evidence for other acute phenomena.  Patient comfortable with outpatient follow-up, medications adjusted here.  Final Clinical Impression(s) / ED Diagnoses Final diagnoses:  Palpitations

## 2021-12-22 NOTE — ED Triage Notes (Signed)
Pt BIB EMS from work. PT has a history of PVC's. Pt has been having chest pain since Friday and states that she is feeling more palpitations. Pt reports left arm pain since Friday. Pt endoreses intermittent weakness to the left leg x4 months. Pt also endorses left arm weakness that has been ongoing for 3 weeks with tingling since yesterday AM.PT given 324 of ASA and 1 nitro en route.   EMS Vitals 121/76 HR 87 16 RR 100% RA CBG 142

## 2022-01-26 ENCOUNTER — Ambulatory Visit: Payer: Self-pay | Attending: Hematology and Oncology

## 2022-03-17 ENCOUNTER — Other Ambulatory Visit: Payer: Self-pay

## 2022-03-17 ENCOUNTER — Telehealth: Payer: Self-pay

## 2022-03-17 DIAGNOSIS — Z87898 Personal history of other specified conditions: Secondary | ICD-10-CM

## 2022-04-29 ENCOUNTER — Ambulatory Visit
Admission: RE | Admit: 2022-04-29 | Discharge: 2022-04-29 | Disposition: A | Discharge status: Home / Self Care | Payer: No Typology Code available for payment source | Source: Ambulatory Visit | Attending: Obstetrics and Gynecology | Admitting: Obstetrics and Gynecology

## 2022-04-29 ENCOUNTER — Ambulatory Visit: Payer: Self-pay | Admitting: Hematology and Oncology

## 2022-04-29 VITALS — BP 169/112 | Wt 252.0 lb

## 2022-04-29 DIAGNOSIS — Z87898 Personal history of other specified conditions: Secondary | ICD-10-CM

## 2022-04-29 NOTE — Patient Instructions (Signed)
[place] about BSE and gave educational materials to take home. Patient did not need a Pap smear today due to last Pap smear was in 2023 per patient. Let her know [facility] will cover Pap smears every 5 years unless has a history of abnormal Pap smears. Referred patient to the [facility] for diagnostic mammogram. Appointment scheduled for 04/29/22. Patient aware of appointment and will be there. Let patient know will follow up with her within the next couple weeks with results. Pollie Meyer verbalized understanding.  Melodye Ped, NP 8:28 AM

## 2022-04-29 NOTE — Progress Notes (Signed)
Ms. TSERING LEAMAN is a 57 y.o. female who presents to [facility] clinic today with no complaints. She is a call back for abnormal mammogram.    Pap Smear: Pap not smear completed today. Last Pap smear was 01/23 and was normal. Per patient has no history of an abnormal Pap smear. Last Pap smear result is available in Epic.   Physical exam: Breasts Breasts symmetrical. No skin abnormalities bilateral breasts. No nipple retraction bilateral breasts. No nipple discharge bilateral breasts. No lymphadenopathy. No lumps palpated bilateral breasts.        Pelvic/Bimanual Pap is not indicated today    Smoking History: Patient has never smoked and was not referred to quit line.    Patient Navigation: Patient education provided. Access to services provided for patient through [facility] program. No interpreter provided. No transportation provided   Colorectal Cancer Screening: Per patient has never had colonoscopy completed No complaints today. She has a pending referral to GI by PCP.   Breast and Cervical Cancer Risk Assessment: Patient has family history of breast cancer, with her maternal aunt and grandmother. Patient does not have history of cervical dysplasia, immunocompromised, or DES exposure in-utero.  Risk Assessment   No risk assessment data     A: [facility] exam without pap smear No complaints with benign exam. Call back for abnormal screening mammogram.  P: Referred patient to the [facility] for a diagnostic mammogram. Appointment scheduled 04/29/22.  Melodye Ped, NP 04/29/2022 8:32 AM

## 2023-03-02 ENCOUNTER — Encounter (HOSPITAL_COMMUNITY): Payer: Self-pay | Admitting: Emergency Medicine

## 2023-03-02 ENCOUNTER — Ambulatory Visit (HOSPITAL_COMMUNITY)
Admission: EM | Admit: 2023-03-02 | Discharge: 2023-03-02 | Disposition: A | Discharge status: Home / Self Care | Payer: No Typology Code available for payment source | Attending: Emergency Medicine | Admitting: Emergency Medicine

## 2023-03-02 DIAGNOSIS — M25511 Pain in right shoulder: Secondary | ICD-10-CM

## 2023-03-02 DIAGNOSIS — M79605 Pain in left leg: Secondary | ICD-10-CM

## 2023-03-02 DIAGNOSIS — S46812A Strain of other muscles, fascia and tendons at shoulder and upper arm level, left arm, initial encounter: Secondary | ICD-10-CM

## 2023-03-02 MED ORDER — IBUPROFEN 800 MG PO TABS: 800.0000 mg | ORAL_TABLET | Freq: Three times a day (TID) | ORAL | 0 refills | Status: DC

## 2023-03-02 MED ORDER — METHOCARBAMOL 500 MG PO TABS: 500.0000 mg | ORAL_TABLET | Freq: Two times a day (BID) | ORAL | 0 refills | Status: DC
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
## 2023-03-02 NOTE — Discharge Instructions (Addendum)
Take the ibuprofen with food 3 times daily to help with pain and inflammation.  For musculoskeletal spasms and stiffness you can take the Robaxin twice daily, especially prior to going to sleep.  This may cause drowsiness so do not drink or drive on this medication.  Continue gentle stretching as tolerated and you can do warm compresses and hot showers to help loosen the muscles.  If your pain persist beyond the next week, please follow-up with an orthopedic for further evaluation.  Return to clinic for any new or urgent symptoms.
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
## 2023-03-04 ENCOUNTER — Encounter (HOSPITAL_COMMUNITY): Payer: Self-pay

## 2023-03-04 ENCOUNTER — Ambulatory Visit (INDEPENDENT_AMBULATORY_CARE_PROVIDER_SITE_OTHER): Payer: Self-pay

## 2023-03-04 ENCOUNTER — Ambulatory Visit (HOSPITAL_COMMUNITY)
Admission: RE | Admit: 2023-03-04 | Discharge: 2023-03-04 | Disposition: A | Discharge status: Home / Self Care | Payer: Self-pay | Source: Ambulatory Visit | Attending: Emergency Medicine | Admitting: Emergency Medicine

## 2023-03-04 VITALS — BP 134/81 | HR 79 | Temp 98.2°F | Resp 24 | Ht 67.0 in

## 2023-03-04 DIAGNOSIS — M436 Torticollis: Secondary | ICD-10-CM

## 2023-03-04 MED ORDER — DEXAMETHASONE SODIUM PHOSPHATE 10 MG/ML IJ SOLN
10.0000 mg | Freq: Once | INTRAMUSCULAR | Status: AC
  Administered 2023-03-04: 10 mg via INTRAMUSCULAR

## 2023-03-04 MED ORDER — DEXAMETHASONE SODIUM PHOSPHATE 10 MG/ML IJ SOLN
INTRAMUSCULAR | Status: AC
  Filled 2023-03-04: qty 1
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
## 2023-03-04 NOTE — Discharge Instructions (Addendum)
Try taking the muscle relaxers with food.  You can heat the area and do gentle stretching as well.  If this persist beyond the next week please follow-up with an orthopedic.  Return to clinic for any new or urgent symptoms.

## 2023-05-09 ENCOUNTER — Other Ambulatory Visit: Payer: Self-pay | Admitting: Obstetrics and Gynecology

## 2023-05-09 DIAGNOSIS — Z1231 Encounter for screening mammogram for malignant neoplasm of breast: Secondary | ICD-10-CM

## 2023-06-07 ENCOUNTER — Other Ambulatory Visit: Payer: Self-pay

## 2023-06-07 ENCOUNTER — Emergency Department (HOSPITAL_BASED_OUTPATIENT_CLINIC_OR_DEPARTMENT_OTHER): Payer: BC Managed Care – PPO | Admitting: Radiology

## 2023-06-07 ENCOUNTER — Encounter (HOSPITAL_BASED_OUTPATIENT_CLINIC_OR_DEPARTMENT_OTHER): Payer: Self-pay | Admitting: Emergency Medicine

## 2023-06-07 ENCOUNTER — Emergency Department (HOSPITAL_BASED_OUTPATIENT_CLINIC_OR_DEPARTMENT_OTHER)
Admission: EM | Admit: 2023-06-07 | Discharge: 2023-06-07 | Discharge status: Against Medical Advice | Payer: BC Managed Care – PPO | Attending: Emergency Medicine | Admitting: Emergency Medicine

## 2023-06-07 DIAGNOSIS — R42 Dizziness and giddiness: Secondary | ICD-10-CM | POA: Diagnosis not present

## 2023-06-07 DIAGNOSIS — R0602 Shortness of breath: Secondary | ICD-10-CM | POA: Insufficient documentation

## 2023-06-07 DIAGNOSIS — Z5321 Procedure and treatment not carried out due to patient leaving prior to being seen by health care provider: Secondary | ICD-10-CM | POA: Insufficient documentation

## 2023-06-07 DIAGNOSIS — R079 Chest pain, unspecified: Secondary | ICD-10-CM | POA: Insufficient documentation

## 2023-06-07 LAB — TROPONIN I (HIGH SENSITIVITY): Troponin I (High Sensitivity): 3 ng/L (ref <18)

## 2023-06-07 LAB — CBC
HCT: 38.2 % (ref 36.0–46.0)
Hemoglobin: 12.3 g/dL (ref 12.0–15.0)
MCH: 25.8 pg — ABNORMAL LOW (ref 26.0–34.0)
MCHC: 32.2 g/dL (ref 30.0–36.0)
MCV: 80.1 fL (ref 80.0–100.0)
Platelets: 277 10*3/uL (ref 150–400)
RBC: 4.77 MIL/uL (ref 3.87–5.11)
RDW: 13.8 % (ref 11.5–15.5)
WBC: 6.2 10*3/uL (ref 4.0–10.5)
nRBC: 0 % (ref 0.0–0.2)

## 2023-06-07 LAB — BASIC METABOLIC PANEL
Anion gap: 7 (ref 5–15)
BUN: 10 mg/dL (ref 6–20)
CO2: 28 mmol/L (ref 22–32)
Calcium: 9.4 mg/dL (ref 8.9–10.3)
Chloride: 102 mmol/L (ref 98–111)
Creatinine, Ser: 0.67 mg/dL (ref 0.44–1.00)
GFR, Estimated: >60 mL/min (ref >60)
Glucose, Bld: 145 mg/dL — ABNORMAL HIGH (ref 70–99)
Potassium: 3.8 mmol/L (ref 3.5–5.1)
Sodium: 137 mmol/L (ref 135–145)

## 2023-06-07 NOTE — ED Triage Notes (Signed)
 Chest pain, under left breast into back and left arm Started last night Some sob, dizziness at onset UC sent for eval, reported changes from prior ekg

## 2023-06-07 NOTE — ED Notes (Signed)
 No answer for rooming.

## 2023-06-08 ENCOUNTER — Emergency Department (HOSPITAL_BASED_OUTPATIENT_CLINIC_OR_DEPARTMENT_OTHER): Payer: BC Managed Care – PPO | Admitting: Radiology

## 2023-06-08 ENCOUNTER — Emergency Department (HOSPITAL_BASED_OUTPATIENT_CLINIC_OR_DEPARTMENT_OTHER)
Admission: EM | Admit: 2023-06-08 | Discharge: 2023-06-08 | Disposition: A | Discharge status: Home / Self Care | Payer: BC Managed Care – PPO | Attending: Emergency Medicine | Admitting: Emergency Medicine

## 2023-06-08 ENCOUNTER — Encounter (HOSPITAL_BASED_OUTPATIENT_CLINIC_OR_DEPARTMENT_OTHER): Payer: Self-pay | Admitting: Emergency Medicine

## 2023-06-08 ENCOUNTER — Other Ambulatory Visit: Payer: Self-pay

## 2023-06-08 DIAGNOSIS — R079 Chest pain, unspecified: Secondary | ICD-10-CM | POA: Diagnosis present

## 2023-06-08 DIAGNOSIS — Z7982 Long term (current) use of aspirin: Secondary | ICD-10-CM | POA: Insufficient documentation

## 2023-06-08 DIAGNOSIS — E669 Obesity, unspecified: Secondary | ICD-10-CM | POA: Insufficient documentation

## 2023-06-08 DIAGNOSIS — Z79899 Other long term (current) drug therapy: Secondary | ICD-10-CM | POA: Insufficient documentation

## 2023-06-08 DIAGNOSIS — Z6839 Body mass index (BMI) 39.0-39.9, adult: Secondary | ICD-10-CM | POA: Insufficient documentation

## 2023-06-08 LAB — COMPREHENSIVE METABOLIC PANEL
ALT: 28 U/L (ref 0–44)
AST: 25 U/L (ref 15–41)
Albumin: 4.2 g/dL (ref 3.5–5.0)
Alkaline Phosphatase: 65 U/L (ref 38–126)
Anion gap: 7 (ref 5–15)
BUN: 9 mg/dL (ref 6–20)
CO2: 27 mmol/L (ref 22–32)
Calcium: 9.5 mg/dL (ref 8.9–10.3)
Chloride: 102 mmol/L (ref 98–111)
Creatinine, Ser: 0.65 mg/dL (ref 0.44–1.00)
GFR, Estimated: >60 mL/min (ref >60)
Glucose, Bld: 148 mg/dL — ABNORMAL HIGH (ref 70–99)
Potassium: 4.2 mmol/L (ref 3.5–5.1)
Sodium: 136 mmol/L (ref 135–145)
Total Bilirubin: 0.7 mg/dL (ref 0.0–1.2)
Total Protein: 7.6 g/dL (ref 6.5–8.1)

## 2023-06-08 LAB — CBC WITH DIFFERENTIAL/PLATELET
Abs Immature Granulocytes: 0.01 10*3/uL (ref 0.00–0.07)
Basophils Absolute: 0 10*3/uL (ref 0.0–0.1)
Basophils Relative: 1 %
Eosinophils Absolute: 0.1 10*3/uL (ref 0.0–0.5)
Eosinophils Relative: 1 %
HCT: 39.2 % (ref 36.0–46.0)
Hemoglobin: 12.4 g/dL (ref 12.0–15.0)
Immature Granulocytes: 0 %
Lymphocytes Relative: 29 %
Lymphs Abs: 1.6 10*3/uL (ref 0.7–4.0)
MCH: 25.5 pg — ABNORMAL LOW (ref 26.0–34.0)
MCHC: 31.6 g/dL (ref 30.0–36.0)
MCV: 80.5 fL (ref 80.0–100.0)
Monocytes Absolute: 0.4 10*3/uL (ref 0.1–1.0)
Monocytes Relative: 7 %
Neutro Abs: 3.5 10*3/uL (ref 1.7–7.7)
Neutrophils Relative %: 62 %
Platelets: 270 10*3/uL (ref 150–400)
RBC: 4.87 MIL/uL (ref 3.87–5.11)
RDW: 14 % (ref 11.5–15.5)
WBC: 5.6 10*3/uL (ref 4.0–10.5)
nRBC: 0 % (ref 0.0–0.2)

## 2023-06-08 LAB — TROPONIN I (HIGH SENSITIVITY): Troponin I (High Sensitivity): 3 ng/L (ref <18)

## 2023-06-08 LAB — D-DIMER, QUANTITATIVE: D-Dimer, Quant: 0.48 ug{FEU}/mL (ref 0.00–0.50)

## 2023-06-08 LAB — BRAIN NATRIURETIC PEPTIDE: B Natriuretic Peptide: 17.6 pg/mL (ref 0.0–100.0)

## 2023-06-08 MED ORDER — IBUPROFEN 400 MG PO TABS
600.0000 mg | ORAL_TABLET | Freq: Once | ORAL | Status: AC
  Administered 2023-06-08: 600 mg via ORAL
  Filled 2023-06-08: qty 1

## 2023-06-08 NOTE — Discharge Instructions (Signed)
 Take ibuprofen and/or Tylenol to help with your pain.  Follow-up with your primary care physician.  If you develop recurrent, continued, or worsening chest pain, shortness of breath, fever, vomiting, abdominal or back pain, or any other new/concerning symptoms then return to the ER for evaluation.

## 2023-06-08 NOTE — ED Provider Notes (Addendum)
 [place] EMERGENCY DEPARTMENT AT [facility] Provider Note   CSN: [id number] Arrival date & time: 06/08/23  0720     History  Chief Complaint  Patient presents with   Chest Pain    Paula Yang is a 58 y.o. female.  HPI 58 year old female presents with on and off chest pain since 2 days ago.  Pain comes and goes is primarily in her left chest.  Feels sharp.  Nothing specific induces it.  When she has that she will feel short of breath.  There is no specific exertional component.  She has a lingering cough from when she had the flu about 3 weeks ago but no new or worsening cough or fever.  She has chronic lower extremity swelling that seems a little better than normal and is primarily in her ankles.  No unilateral calf pain or swelling.  Right now she is asymptomatic.  No pleuritic component or positional component.  She came here yesterday but left without being seen.  Home Medications Prior to Admission medications   Medication Sig Start Date End Date Taking? Authorizing Provider  acyclovir (ZOVIRAX) 400 MG tablet TAKE 1 TABLET BY MOUTH TWICE A DAY 02/10/18   Doren Custard, FNP  albuterol (PROAIR HFA) 108 (90 Base) MCG/ACT inhaler Inhale 1-2 puffs into the lungs every 4 (four) hours as needed for wheezing. 07/14/17   Kerman Passey, MD  aspirin EC 81 MG tablet Take 81 mg by mouth daily. 08/11/16   Kerman Passey, MD  fluticasone (FLONASE) 50 MCG/ACT nasal spray Place 2 sprays into both nostrils daily as needed for allergies. 07/14/17   Kerman Passey, MD  furosemide (LASIX) 20 MG tablet Take 1 tablet (20 mg total) by mouth daily as needed. Patient taking differently: Take 20 mg by mouth daily as needed for fluid. 07/21/15   Antonieta Iba, MD  ibuprofen (ADVIL) 800 MG tablet Take 1 tablet (800 mg total) by mouth 3 (three) times daily. 03/02/23   Garrison, [place] N, FNP  lisinopril-hydrochlorothiazide (ZESTORETIC) 10-12.5 MG tablet Take 1 tablet by mouth daily. 04/17/22    [provider]  loratadine (CLARITIN) 10 MG tablet Take 10 mg by mouth daily.    [provider]  Magnesium Oxide 400 MG CAPS Take 1 capsule by mouth daily. Patient not taking: Reported on 03/02/2023 11/30/21   [provider]  meclizine (ANTIVERT) 25 MG tablet Take 1 tablet (25 mg total) by mouth 3 (three) times daily as needed for dizziness. Patient not taking: Reported on 04/29/2022 09/20/17   McDonald, Pedro Earls A, PA-C  methocarbamol (ROBAXIN) 500 MG tablet Take 1 tablet (500 mg total) by mouth 2 (two) times daily. 03/02/23   Garrison, [place] N, FNP  metoprolol succinate (TOPROL-XL) 25 MG 24 hr tablet Take 12.5 mg by mouth daily. 07/17/19   [provider]  omeprazole (PRILOSEC) 20 MG capsule Take 20 mg by mouth daily. 03/29/22   [provider]  ondansetron (ZOFRAN ODT) 4 MG disintegrating tablet Take 1 tablet (4 mg total) by mouth every 8 (eight) hours as needed for nausea or vomiting. Patient not taking: Reported on 11/17/2020 09/20/17   McDonald, Mia A, PA-C  potassium chloride (K-DUR) 10 MEQ tablet Take 1 tablet (10 mEq total) by mouth daily as needed. Patient not taking: Reported on 03/02/2023 07/21/15   Antonieta Iba, MD  Vitamin D, Ergocalciferol, (DRISDOL) 1.25 MG (50000 UNIT) CAPS capsule Take 50,000 Units by mouth once a week. Patient not taking:  Reported on 03/02/2023 02/28/22   [provider]      Allergies    Patient has no known allergies.    Review of Systems   Review of Systems  Constitutional:  Negative for fever.  Respiratory:  Positive for shortness of breath.   Cardiovascular:  Positive for chest pain and leg swelling.  Gastrointestinal:  Negative for abdominal pain.    Physical Exam Updated Vital Signs BP 125/85   Pulse 80   Temp 98.1 F (36.7 C) (Oral)   Resp 17   Ht 5\' 7"  (1.702 m)   Wt 114.3 kg   LMP 08/22/2017   SpO2 100%   BMI 39.47 kg/m  Physical Exam Vitals and nursing note reviewed.   Constitutional:      General: She is not in acute distress.    Appearance: She is well-developed. She is obese. She is not ill-appearing or diaphoretic.  HENT:     Head: Normocephalic and atraumatic.  Cardiovascular:     Rate and Rhythm: Normal rate and regular rhythm.     Heart sounds: Normal heart sounds.  Pulmonary:     Effort: Pulmonary effort is normal.     Breath sounds: Normal breath sounds.  Abdominal:     Palpations: Abdomen is soft.     Tenderness: There is no abdominal tenderness.  Musculoskeletal:     Comments: Mild ankle swelling bilaterally. No calf swelling/tenderness.  Skin:    General: Skin is warm and dry.  Neurological:     Mental Status: She is alert.     ED Results / Procedures / Treatments   Labs (all labs ordered are listed, but only abnormal results are displayed) Labs Reviewed  CBC WITH DIFFERENTIAL/PLATELET - Abnormal; Notable for the following components:      Result Value   MCH 25.5 (*)    All other components within normal limits  COMPREHENSIVE METABOLIC PANEL - Abnormal; Notable for the following components:   Glucose, Bld 148 (*)    All other components within normal limits  D-DIMER, QUANTITATIVE  BRAIN NATRIURETIC PEPTIDE  TROPONIN I (HIGH SENSITIVITY)    EKG EKG Interpretation Date/Time:  Wednesday June 08 2023 07:39:24 EST Ventricular Rate:  70 PR Interval:  166 QRS Duration:  96 QT Interval:  419 QTC Calculation: 453 R Axis:   13  Text Interpretation: Sinus rhythm Left ventricular hypertrophy Borderline T abnormalities, inferior leads Baseline wander in lead(s) II III aVL aVF overall similar to yesterday Confirmed by Pricilla Loveless [phone]) on 06/08/2023 8:51:37 AM  Radiology DG Chest 2 View Result Date: 06/08/2023 CLINICAL DATA:  chest pain, left sided. EXAM: CHEST - 2 VIEW COMPARISON:  06/07/2023. FINDINGS: Bilateral lung fields are clear. Bilateral costophrenic angles are clear. Normal cardio-mediastinal silhouette. No  acute osseous abnormalities. The soft tissues are within normal limits. IMPRESSION: No active cardiopulmonary disease. Electronically Signed   By: Jules Schick M.D.   On: 06/08/2023 10:50   DG Chest 2 View Result Date: 06/07/2023 CLINICAL DATA:  Chest pain. EXAM: CHEST - 2 VIEW COMPARISON:  12/22/2021. FINDINGS: Low lung volume. Bilateral lung fields are clear. Bilateral costophrenic angles are clear. Normal cardio-mediastinal silhouette. No acute osseous abnormalities. The soft tissues are within normal limits. IMPRESSION: No active cardiopulmonary disease. Electronically Signed   By: Jules Schick M.D.   On: 06/07/2023 15:23    Procedures Procedures    Medications Ordered in ED Medications  ibuprofen (ADVIL) tablet 600 mg (600 mg Oral Given 06/08/23 1020)    ED  Course/ Medical Decision Making/ A&P                                 Medical Decision Making Amount and/or Complexity of Data Reviewed Labs: ordered.    Details: Normal troponin, BNP, and D-dimer Radiology: ordered and independent interpretation performed.    Details: No CHF ECG/medicine tests: ordered and independent interpretation performed.    Details: No ischemia   Patient presents with chest pain has been coming and going for couple days.  Had a troponin yesterday was negative, another troponin today is negative and while she has been having intermittent symptoms, I have a low suspicion she has ACS and do not think further troponins are needed.  No exertional component or other red flags on history/exam.  Doubt ACS, PE, etc.  Will have her follow-up with her PCP and use NSAIDs if having recurrent symptoms.  She also states that this is a recurrent problem for her and so I suspect this is not an emergent condition.  Given return precautions.        Final Clinical Impression(s) / ED Diagnoses Final diagnoses:  Nonspecific chest pain    Rx / DC Orders ED Discharge Orders     None         Pricilla Loveless, MD 06/08/23 1435
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
## 2023-07-12 ENCOUNTER — Ambulatory Visit: Admitting: Family Medicine

## 2023-08-08 ENCOUNTER — Encounter: Payer: Self-pay | Admitting: Family Medicine

## 2023-08-08 ENCOUNTER — Ambulatory Visit: Admitting: Family Medicine

## 2023-08-08 VITALS — BP 132/82 | HR 75 | Resp 16 | Ht 67.0 in | Wt 256.0 lb

## 2023-08-08 DIAGNOSIS — J309 Allergic rhinitis, unspecified: Secondary | ICD-10-CM

## 2023-08-08 DIAGNOSIS — R718 Other abnormality of red blood cells: Secondary | ICD-10-CM

## 2023-08-08 DIAGNOSIS — Z0000 Encounter for general adult medical examination without abnormal findings: Secondary | ICD-10-CM | POA: Diagnosis not present

## 2023-08-08 DIAGNOSIS — R7303 Prediabetes: Secondary | ICD-10-CM

## 2023-08-08 DIAGNOSIS — K219 Gastro-esophageal reflux disease without esophagitis: Secondary | ICD-10-CM | POA: Diagnosis not present

## 2023-08-08 DIAGNOSIS — Z6841 Body Mass Index (BMI) 40.0 and over, adult: Secondary | ICD-10-CM | POA: Insufficient documentation

## 2023-08-08 DIAGNOSIS — E66813 Obesity, class 3: Secondary | ICD-10-CM

## 2023-08-08 DIAGNOSIS — I10 Essential (primary) hypertension: Secondary | ICD-10-CM

## 2023-08-08 DIAGNOSIS — R600 Localized edema: Secondary | ICD-10-CM

## 2023-08-08 DIAGNOSIS — I493 Ventricular premature depolarization: Secondary | ICD-10-CM

## 2023-08-08 DIAGNOSIS — E559 Vitamin D deficiency, unspecified: Secondary | ICD-10-CM

## 2023-08-08 NOTE — Progress Notes (Signed)
 Name: Paula Yang   MRN: [id number]    DOB: 01-05-1966   Date:08/08/2023       Progress Note  Chief Complaint  Patient presents with   Establish Care   Prediabetes    Feels she may have diabetes, but was always told prediabetes. Would like to have checked.     Subjective:   Paula Yang is a 58 y.o. female, presents to clinic to est care  Here to est care was seeing Dr. Arn Yang and maybe a Paula Yang? In [place]?  Currently on lisinopril -HCTZ 10-12.5, BB, lasix ? And reports hx of prediabetes BP Readings from Last 3 Encounters:  08/08/23 132/82  06/08/23 125/85  06/07/23 (!) 144/100   On Metoprolol 12.5 mg daily - for PVCs palpitations Pulse Readings from Last 3 Encounters:  08/08/23 75  06/08/23 80  06/07/23 80   Requesting records Lab Results  Component Value Date   HGBA1C 6.0 (H) 08/11/2016    Did mammogram last year January - due GI Health Maintenance  Topic Date Due   Hepatitis C Screening  Never done   DTaP/Tdap/Td vaccine (1 - Tdap) Never done   Pneumococcal Vaccination (1 of 2 - PCV) Never done   Colon Cancer Screening  Never done   Zoster (Shingles) Vaccine (1 of 2) Never done   COVID-19 Vaccine (4 - 2024-25 season) 12/12/2022   Mammogram  04/30/2023   Flu Shot  11/11/2023   Pap with HPV screening  11/30/2024   HIV Screening  Completed   HPV Vaccine  Aged Out   Meningitis B Vaccine  Aged Out   Due for CPE due to lack of insurance - used to do it in Nov/Dec  LE edema - has old lasix  and no potassium, worse sx, legs and sometimes to hands in the am  Sugars she has checked at home and been high sometimes over 200 -  Wt Readings from Last 5 Encounters:  08/08/23 256 lb (116.1 kg)  06/08/23 252 lb (114.3 kg)  04/29/22 252 lb (114.3 kg)  12/22/21 232 lb (105.2 kg)  05/22/21 240 lb (108.9 kg)   BMI Readings from Last 5 Encounters:  08/08/23 40.10 kg/m  06/08/23 39.47 kg/m  03/04/23 39.47 kg/m  04/29/22 39.47 kg/m  12/22/21  36.34 kg/m       Current Outpatient Medications:    albuterol  (PROAIR  HFA) 108 (90 Base) MCG/ACT inhaler, Inhale 1-2 puffs into the lungs every 4 (four) hours as needed for wheezing., Disp: 1 Inhaler, Rfl: 1   furosemide  (LASIX ) 20 MG tablet, Take 1 tablet (20 mg total) by mouth daily as needed., Disp: 30 tablet, Rfl: 3   lisinopril -hydrochlorothiazide  (ZESTORETIC ) 10-12.5 MG tablet, Take 1 tablet by mouth daily., Disp: , Rfl:    metoprolol succinate (TOPROL-XL) 25 MG 24 hr tablet, Take 12.5 mg by mouth daily., Disp: , Rfl:    omeprazole  (PRILOSEC) 20 MG capsule, Take 20 mg by mouth daily., Disp: , Rfl:   Patient Active Problem List   Diagnosis Date Noted   Prediabetes 09/16/2017   Microcytosis 09/16/2017   Elevated glucose 12/16/2015   Encounter for medication monitoring 12/16/2015   Abnormal mammography 09/26/2015   Toenail deformity 09/04/2015   Annual physical exam 06/03/2015   Encounter for cholesteral screening for cardiovascular disease 06/03/2015   Encounter for screening mammogram for malignant neoplasm of breast 06/03/2015   Encounter for screening for malignant neoplasm of cervix 06/03/2015   Breast mass, left 06/03/2015   Left knee pain  06/03/2015   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   Shortness of breath 05/21/2015   Morbid obesity (HCC) 05/21/2015   Irregular heart beat 03/26/2015   Abdominal pain 08/05/2014   Allergic rhinitis 08/05/2014   Routine general medical examination at a health care facility 08/05/2014   Gastro-esophageal reflux disease without esophagitis 08/05/2014   Genital herpes simplex type 2 08/05/2014   Headache, variant migraine 08/05/2014   Hypertension goal BP (blood pressure) < 140/90 08/05/2014   Asthma, mild intermittent 08/05/2014   Back pain, chronic 11/30/2006    Past Surgical History:  Procedure Laterality Date   CESAREAN SECTION  1991    Family History  Problem Relation Age of Onset   Heart disease Brother    Heart disease Maternal  Aunt    Diabetes Mother    Kidney disease Mother        died at 28 yo from kidney failure   Diabetes Maternal Aunt    Diabetes Maternal Uncle    Diabetes Maternal Grandmother    Breast cancer Maternal Grandmother    Breast cancer Maternal Aunt    Colon cancer Paternal Grandfather     Social History   Tobacco Use   Smoking status: Never   Smokeless tobacco: Never  Substance Use Topics   Alcohol use: No    Alcohol/week: 0.0 standard drinks of alcohol   Drug use: No     No Known Allergies  Health Maintenance  Topic Date Due   Hepatitis C Screening  Never done   DTaP/Tdap/Td (1 - Tdap) Never done   Pneumococcal Vaccine 44-50 Years old (1 of 2 - PCV) Never done   Colonoscopy  Never done   Zoster Vaccines- Shingrix (1 of 2) Never done   COVID-19 Vaccine (4 - 2024-25 season) 12/12/2022   MAMMOGRAM  04/30/2023   INFLUENZA VACCINE  11/11/2023   Cervical Cancer Screening (HPV/Pap Cotest)  11/30/2024   HIV Screening  Completed   HPV VACCINES  Aged Out   Meningococcal B Vaccine  Aged Out    Chart Review Today: I personally reviewed active problem list, medication list, allergies, family history, social history, health maintenance, notes from last encounter, lab results, imaging with the patient/caregiver today.   Review of Systems  Constitutional: Negative.   HENT: Negative.    Eyes: Negative.   Respiratory: Negative.    Cardiovascular: Negative.   Gastrointestinal: Negative.   Endocrine: Negative.   Genitourinary: Negative.   Musculoskeletal: Negative.   Skin: Negative.   Allergic/Immunologic: Negative.   Neurological: Negative.   Hematological: Negative.   Psychiatric/Behavioral: Negative.    All other systems reviewed and are negative.    Objective:   Vitals:   08/08/23 1329  BP: 132/82  Pulse: 75  Resp: 16  SpO2: 98%  Weight: 256 lb (116.1 kg)  Height: 5\' 7"  (1.702 m)    Body mass index is 40.1 kg/m.  Physical Exam Vitals and nursing note  reviewed.  Constitutional:      General: She is not in acute distress.    Appearance: She is well-developed. She is obese. She is not ill-appearing, toxic-appearing or diaphoretic.  HENT:     Head: Normocephalic and atraumatic.     Nose: Nose normal.  Eyes:     General:        Right eye: No discharge.        Left eye: No discharge.     Conjunctiva/sclera: Conjunctivae normal.  Neck:     Trachea: No tracheal deviation.  Cardiovascular:  Rate and Rhythm: Normal rate and regular rhythm.     Pulses: Normal pulses.     Heart sounds: Normal heart sounds. No murmur heard.    No friction rub. No gallop.  Pulmonary:     Effort: Pulmonary effort is normal. No respiratory distress.     Breath sounds: Normal breath sounds. No stridor.  Musculoskeletal:     Right lower leg: No edema.     Left lower leg: No edema.  Skin:    General: Skin is warm and dry.     Findings: No rash.  Neurological:     Mental Status: She is alert.     Motor: No abnormal muscle tone.     Coordination: Coordination normal.  Psychiatric:        Behavior: Behavior normal.      Functional Status Survey: Is the patient deaf or have difficulty hearing?: No Does the patient have difficulty seeing, even when wearing glasses/contacts?: No Does the patient have difficulty concentrating, remembering, or making decisions?: No Does the patient have difficulty walking or climbing stairs?: Yes Does the patient have difficulty dressing or bathing?: No Does the patient have difficulty doing errands alone such as visiting a doctor's office or shopping?: No Results for orders placed or performed during the hospital encounter of 06/08/23  CBC with Differential   Collection Time: 06/08/23 10:37 AM  Result Value Ref Range   WBC 5.6 4.0 - 10.5 K/uL   RBC 4.87 3.87 - 5.11 MIL/uL   Hemoglobin 12.4 12.0 - 15.0 g/dL   HCT 16.1 09.6 - 04.5 %   MCV 80.5 80.0 - 100.0 fL   MCH 25.5 (L) 26.0 - 34.0 pg   MCHC 31.6 30.0 - 36.0 g/dL    RDW 40.9 81.1 - 91.4 %   Platelets 270 150 - 400 K/uL   nRBC 0.0 0.0 - 0.2 %   Neutrophils Relative % 62 %   Neutro Abs 3.5 1.7 - 7.7 K/uL   Lymphocytes Relative 29 %   Lymphs Abs 1.6 0.7 - 4.0 K/uL   Monocytes Relative 7 %   Monocytes Absolute 0.4 0.1 - 1.0 K/uL   Eosinophils Relative 1 %   Eosinophils Absolute 0.1 0.0 - 0.5 K/uL   Basophils Relative 1 %   Basophils Absolute 0.0 0.0 - 0.1 K/uL   Immature Granulocytes 0 %   Abs Immature Granulocytes 0.01 0.00 - 0.07 K/uL  D-dimer, quantitative   Collection Time: 06/08/23 10:37 AM  Result Value Ref Range   D-Dimer, Quant 0.48 0.00 - 0.50 ug/mL-FEU  Comprehensive metabolic panel   Collection Time: 06/08/23 10:37 AM  Result Value Ref Range   Sodium 136 135 - 145 mmol/L   Potassium 4.2 3.5 - 5.1 mmol/L   Chloride 102 98 - 111 mmol/L   CO2 27 22 - 32 mmol/L   Glucose, Bld 148 (H) 70 - 99 mg/dL   BUN 9 6 - 20 mg/dL   Creatinine, Ser 7.82 0.44 - 1.00 mg/dL   Calcium 9.5 8.9 - 95.6 mg/dL   Total Protein 7.6 6.5 - 8.1 g/dL   Albumin 4.2 3.5 - 5.0 g/dL   AST 25 15 - 41 U/L   ALT 28 0 - 44 U/L   Alkaline Phosphatase 65 38 - 126 U/L   Total Bilirubin 0.7 0.0 - 1.2 mg/dL   GFR, Estimated >21 >30 mL/min   Anion gap 7 5 - 15  Troponin I (High Sensitivity)   Collection Time: 06/08/23 10:37 AM  Result Value Ref Range   Troponin I (High Sensitivity) 3 <18 ng/L  Brain natriuretic peptide   Collection Time: 06/08/23 10:38 AM  Result Value Ref Range   B Natriuretic Peptide 17.6 0.0 - 100.0 pg/mL      Assessment & Plan:     ICD-10-CM   1. Encounter for medical examination to establish care  Z00.00    reviewed old labs in this EMR, req records from recent EMR and will need to update chart once recieved    2. Prediabetes  R73.03 HgB A1c    Comprehensive Metabolic Panel (CMET)   She is concerned about rechecking A1c for prediabetes concerned that she has type 2 diabetes    3. Gastro-esophageal reflux disease without esophagitis   K21.9 CBC with Differential/Platelet    Comprehensive Metabolic Panel (CMET)   Managed with omeprazole     4. Primary hypertension  I10 Comprehensive Metabolic Panel (CMET)   Blood pressure near goal today on lisinopril -hydrochlorothiazide     5. Vitamin D deficiency  E55.9 Comprehensive Metabolic Panel (CMET)   Will check chemistry and obtain records to see if this needs to be rechecked or supplemented with prescription    6. Microcytosis  R71.8 CBC with Differential/Platelet    Iron, TIBC and Ferritin Panel    Iron, TIBC and Ferritin Panel    TEST AUTHORIZATION   If lab abnormalities will add on iron panel    7. Allergic rhinitis, unspecified seasonality, unspecified trigger  J30.9 CBC with Differential/Platelet   Some of her symptoms are likely allergic, can manage with over-the-counter second-generation antihistamines    8. Class 3 severe obesity with body mass index (BMI) of 40.0 to 44.9 in adult, unspecified obesity type, unspecified whether serious comorbidity present  E66.813 HgB A1c   Z68.41 CBC with Differential/Platelet    Comprehensive Metabolic Panel (CMET)    Lipid Profile   With a few known comorbidities including hypertension, PVCs, prediabetes    9. Bilateral leg edema  R60.0 Ambulatory referral to Cardiology   more swelling in legs/ankle, some new sweling in hands, in the past she has been prescribed Lasix  but she is concerned about taking it without potassium    10. PVC (premature ventricular contraction)  I49.3 Ambulatory referral to Cardiology   hx of needs to est with cardiology        Return in about 1 month (around 09/07/2023) for CPE.   Adeline Hone, Paula-C 08/08/23 1:42 PM

## 2023-08-10 LAB — CBC WITH DIFFERENTIAL/PLATELET
Absolute Lymphocytes: 1581 {cells}/uL (ref 850–3900)
Absolute Monocytes: 454 {cells}/uL (ref 200–950)
Basophils Absolute: 32 {cells}/uL (ref 0–200)
Basophils Relative: 0.5 %
Eosinophils Absolute: 109 {cells}/uL (ref 15–500)
Eosinophils Relative: 1.7 %
HCT: 38.5 % (ref 35.0–45.0)
Hemoglobin: 12.3 g/dL (ref 11.7–15.5)
MCH: 25.2 pg — ABNORMAL LOW (ref 27.0–33.0)
MCHC: 31.9 g/dL — ABNORMAL LOW (ref 32.0–36.0)
MCV: 78.9 fL — ABNORMAL LOW (ref 80.0–100.0)
MPV: 10.7 fL (ref 7.5–12.5)
Monocytes Relative: 7.1 %
Neutro Abs: 4224 {cells}/uL (ref 1500–7800)
Neutrophils Relative %: 66 %
Platelets: 281 10*3/uL (ref 140–400)
RBC: 4.88 10*6/uL (ref 3.80–5.10)
RDW: 14 % (ref 11.0–15.0)
Total Lymphocyte: 24.7 %
WBC: 6.4 10*3/uL (ref 3.8–10.8)

## 2023-08-10 LAB — LIPID PANEL
Cholesterol: 203 mg/dL — ABNORMAL HIGH (ref <200)
HDL: 40 mg/dL — ABNORMAL LOW (ref >=50)
LDL Cholesterol (Calc): 135 mg/dL — ABNORMAL HIGH
Non-HDL Cholesterol (Calc): 163 mg/dL — ABNORMAL HIGH (ref <130)
Total CHOL/HDL Ratio: 5.1 (calc) — ABNORMAL HIGH (ref <5.0)
Triglycerides: 146 mg/dL (ref <150)

## 2023-08-10 LAB — COMPREHENSIVE METABOLIC PANEL WITH GFR
AG Ratio: 1.3 (calc) (ref 1.0–2.5)
ALT: 28 U/L (ref 6–29)
AST: 26 U/L (ref 10–35)
Albumin: 4.3 g/dL (ref 3.6–5.1)
Alkaline phosphatase (APISO): 76 U/L (ref 37–153)
BUN: 8 mg/dL (ref 7–25)
CO2: 30 mmol/L (ref 20–32)
Calcium: 9.5 mg/dL (ref 8.6–10.4)
Chloride: 100 mmol/L (ref 98–110)
Creat: 0.69 mg/dL (ref 0.50–1.03)
Globulin: 3.4 g/dL (ref 1.9–3.7)
Glucose, Bld: 112 mg/dL — ABNORMAL HIGH (ref 65–99)
Potassium: 4.2 mmol/L (ref 3.5–5.3)
Sodium: 139 mmol/L (ref 135–146)
Total Bilirubin: 0.5 mg/dL (ref 0.2–1.2)
Total Protein: 7.7 g/dL (ref 6.1–8.1)
eGFR: 101 mL/min/{1.73_m2} (ref >=60)

## 2023-08-10 LAB — IRON,TIBC AND FERRITIN PANEL
%SAT: 22 % (ref 16–45)
Ferritin: 261 ng/mL — ABNORMAL HIGH (ref 16–232)
Iron: 70 ug/dL (ref 45–160)
TIBC: 318 ug/dL (ref 250–450)

## 2023-08-10 LAB — HEMOGLOBIN A1C
Hgb A1c MFr Bld: 8.2 % — ABNORMAL HIGH (ref <5.7)
Mean Plasma Glucose: 189 mg/dL
eAG (mmol/L): 10.4 mmol/L

## 2023-08-10 LAB — TEST AUTHORIZATION

## 2023-08-11 ENCOUNTER — Encounter: Payer: Self-pay | Admitting: Family Medicine

## 2023-08-16 ENCOUNTER — Ambulatory Visit: Admitting: Family Medicine

## 2023-08-18 ENCOUNTER — Encounter (HOSPITAL_COMMUNITY): Payer: Self-pay

## 2023-08-23 ENCOUNTER — Telehealth: Payer: Self-pay

## 2023-08-23 NOTE — Telephone Encounter (Signed)
 Called number on file- still out of service. Unable to reach/lvm w/daughter

## 2023-08-23 NOTE — Telephone Encounter (Signed)
 Copied from CRM [id number]. Topic: Referral - Question >> Aug 23, 2023 12:41 PM Sophia H wrote: Reason for CRM: Spoke with Abe Abed from Cardiology. Stated they received the referral for the patient but are unable to schedule, phone number on file is out of service. When she reached out to daughter she was unable to lvm due to vmail not being set up. Please try to get in touch with patient and have her call cardiology scheduling [phone]. Ty

## 2023-08-25 ENCOUNTER — Ambulatory Visit: Admitting: Family Medicine

## 2023-09-01 ENCOUNTER — Ambulatory Visit: Admitting: Family Medicine

## 2023-09-08 ENCOUNTER — Encounter: Admitting: Family Medicine

## 2023-09-26 ENCOUNTER — Other Ambulatory Visit: Payer: Self-pay | Admitting: Family Medicine

## 2023-09-26 NOTE — Telephone Encounter (Unsigned)
 Copied from CRM [id number]. Topic: Clinical - Medication Refill >> Sep 26, 2023 11:19 AM Crispin Dolphin wrote: Medication: lisinopril -hydrochlorothiazide  (ZESTORETIC ) 10-12.5 MG tablet  Has the patient contacted their pharmacy? Yes (Agent: If no, request that the patient contact the pharmacy for the refill. If patient does not wish to contact the pharmacy document the reason why and proceed with request.) (Agent: If yes, when and what did the pharmacy advise?) need provider to send  This is the patient's preferred pharmacy:  [id number] [place]ette Nestle, [place] - [address] [place] [address] Phone: [phone] Fax: [phone]  Is this the correct pharmacy for this prescription? Yes If no, delete pharmacy and type the correct one.   Has the prescription been filled recently? No  Is the patient out of the medication? Yes last one taken Friday   Has the patient been seen for an appointment in the last year OR does the patient have an upcoming appointment? Yes  Can we respond through MyChart? No  Agent: Please be advised that Rx refills may take up to 3 business days. We ask that you follow-up with your pharmacy.

## 2023-09-28 NOTE — Telephone Encounter (Signed)
 Requested medication (s) are due for refill today: Yes  Requested medication (s) are on the active medication list: Yes  Last refill:  04/29/22  Future visit scheduled: yes  Notes to clinic:  Historical provider,    Requested Prescriptions  Pending Prescriptions Disp Refills   lisinopril -hydrochlorothiazide  (ZESTORETIC ) 10-12.5 MG tablet      Sig: Take 1 tablet by mouth daily.     Cardiovascular:  ACEI + Diuretic Combos Failed - 09/28/2023  1:30 PM      Failed - Valid encounter within last 6 months    Recent Outpatient Visits           1 month ago Encounter for medical examination to establish care   [facility] Adeline Hone, [place]              Passed - Na in normal range and within 180 days    Sodium  Date Value Ref Range Status  08/08/2023 139 135 - 146 mmol/L Final  03/26/2015 140 134 - 144 mmol/L Final    Comment:                  **Please note reference interval change**  12/09/2012 135 (L) 136 - 145 mmol/L Final         Passed - K in normal range and within 180 days    Potassium  Date Value Ref Range Status  08/08/2023 4.2 3.5 - 5.3 mmol/L Final  12/09/2012 3.4 (L) 3.5 - 5.1 mmol/L Final         Passed - Cr in normal range and within 180 days    Creat  Date Value Ref Range Status  08/08/2023 0.69 0.50 - 1.03 mg/dL Final         Passed - eGFR is 30 or above and within 180 days    GFR, Est African American  Date Value Ref Range Status  08/11/2016 >89 >=60 mL/min Final   GFR calc Af Amer  Date Value Ref Range Status  09/20/2017 >60 >60 mL/min Final    Comment:    (NOTE) The eGFR has been calculated using the CKD EPI equation. This calculation has not been validated in all clinical situations. eGFR's persistently <60 mL/min signify possible Chronic Kidney Disease.    GFR, Est Non African American  Date Value Ref Range Status  08/11/2016 89 >=60 mL/min Final   GFR, Estimated  Date Value Ref Range Status  06/08/2023  >60 >60 mL/min Final    Comment:    (NOTE) Calculated using the CKD-EPI Creatinine Equation (2021)    eGFR  Date Value Ref Range Status  08/08/2023 101 > OR = 60 mL/min/1.36m2 Final         Passed - Patient is not pregnant      Passed - Last BP in normal range    BP Readings from Last 1 Encounters:  08/08/23 132/82

## 2023-09-30 ENCOUNTER — Encounter: Admitting: Family Medicine

## 2023-10-17 ENCOUNTER — Encounter: Admitting: Family Medicine

## 2023-10-19 ENCOUNTER — Ambulatory Visit (INDEPENDENT_AMBULATORY_CARE_PROVIDER_SITE_OTHER): Admitting: Family Medicine

## 2023-10-19 ENCOUNTER — Encounter: Payer: Self-pay | Admitting: Family Medicine

## 2023-10-19 VITALS — BP 128/84 | HR 80 | Resp 16 | Ht 67.0 in | Wt 247.0 lb

## 2023-10-19 DIAGNOSIS — Z1211 Encounter for screening for malignant neoplasm of colon: Secondary | ICD-10-CM

## 2023-10-19 DIAGNOSIS — Z0000 Encounter for general adult medical examination without abnormal findings: Secondary | ICD-10-CM

## 2023-10-19 DIAGNOSIS — E66812 Obesity, class 2: Secondary | ICD-10-CM | POA: Diagnosis not present

## 2023-10-19 DIAGNOSIS — Z6838 Body mass index (BMI) 38.0-38.9, adult: Secondary | ICD-10-CM | POA: Diagnosis not present

## 2023-10-19 DIAGNOSIS — E119 Type 2 diabetes mellitus without complications: Secondary | ICD-10-CM | POA: Diagnosis not present

## 2023-10-19 DIAGNOSIS — Z1231 Encounter for screening mammogram for malignant neoplasm of breast: Secondary | ICD-10-CM

## 2023-10-19 MED ORDER — BLOOD GLUCOSE MONITORING SUPPL DEVI
0 refills | Status: AC
Start: 2023-10-19 — End: ?

## 2023-10-19 MED ORDER — LANCETS MISC. MISC: 3 refills | Status: AC

## 2023-10-19 MED ORDER — PANTOPRAZOLE SODIUM 40 MG PO TBEC: 40.0000 mg | DELAYED_RELEASE_TABLET | Freq: Every day | ORAL | 3 refills | Status: DC

## 2023-10-19 MED ORDER — BLOOD GLUCOSE TEST VI STRP: ORAL_STRIP | 3 refills | Status: DC

## 2023-10-19 NOTE — Progress Notes (Signed)
 Patient: Paula Yang, Female    DOB: 14-Dec-1965, 58 y.o.   MRN: [id number] Leavy Mole, PA-C Visit Date: 10/19/2023  Today's Provider: Mole Leavy, PA-C   Chief Complaint  Patient presents with   Annual Exam   Subjective:   Annual physical exam:  Paula Yang is a 58 y.o. female who presents today for complete physical exam:  Exercise/Activity:  exercising - see SDOH 3d week 60 + min Diet/nutrition:  cut out fast food, eating better  SDOH Screenings   Food Insecurity: No Food Insecurity (08/08/2023)  Housing: Unknown (08/08/2023)  Transportation Needs: No Transportation Needs (08/08/2023)  Utilities: Not At Risk (08/08/2023)  Alcohol Screen: Low Risk  (08/08/2023)  Depression (PHQ2-9): Low Risk  (10/19/2023)  Financial Resource Strain: Low Risk  (08/08/2023)  Physical Activity: Sufficiently Active (08/08/2023)  Social Connections: Moderately Integrated (08/08/2023)  Stress: No Stress Concern Present (08/08/2023)  Tobacco Use: Low Risk  (10/19/2023)  Health Literacy: Adequate Health Literacy (08/08/2023)    Needs to do f/up with new onset DM  Lab Results  Component Value Date   HGBA1C 8.2 (H) 08/08/2023    USPSTF grade A and B recommendations - reviewed and addressed today  Depression:  Phq 9 completed today by patient, was reviewed by me with patient in the room PHQ score is neg, pt feels mood is good    10/19/2023    1:28 PM 08/08/2023    1:35 PM 08/11/2016    8:17 AM 09/04/2015    8:05 AM  PHQ 2/9 Scores  PHQ - 2 Score 0 0 1 0  PHQ- 9 Score  0        10/19/2023    1:28 PM 08/08/2023    1:35 PM 08/11/2016    8:17 AM 09/04/2015    8:05 AM 03/26/2015   10:59 AM  Depression screen PHQ 2/9  Decreased Interest 0 0 0 0 0  Down, Depressed, Hopeless 0 0 1 0 0  PHQ - 2 Score 0 0 1 0 0  Altered sleeping  0     Tired, decreased energy  0     Change in appetite  0     Feeling bad or failure about yourself   0     Trouble concentrating  0     Moving slowly or  fidgety/restless  0     Suicidal thoughts  0     PHQ-9 Score  0       Alcohol screening: Flowsheet Row Office Visit from 08/08/2023 in [facility]  AUDIT-C Score 0    Immunizations and Health Maintenance: Health Maintenance  Topic Date Due   Hepatitis C Screening  Never done   Colonoscopy  Never done   COVID-19 Vaccine (4 - 2024-25 season) 11/03/2023 (Originally 12/12/2022)   Zoster Vaccines- Shingrix (1 of 2) 01/18/2024 (Originally 04/25/2016)   DTaP/Tdap/Td (1 - Tdap) 10/17/2024 (Originally 04/25/1985)   Pneumococcal Vaccine 45-83 Years old (1 of 2 - PCV) 10/17/2024 (Originally 04/25/1985)   Hepatitis B Vaccines (1 of 3 - 19+ 3-dose series) 10/17/2024 (Originally 04/25/1985)   MAMMOGRAM  10/18/2024 (Originally 04/30/2023)   INFLUENZA VACCINE  11/11/2023   Cervical Cancer Screening (HPV/Pap Cotest)  11/30/2024   HIV Screening  Completed   HPV VACCINES  Aged Out   Meningococcal B Vaccine  Aged Out     Hep C Screening: due - weill get with next labs  STD testing and prevention (HIV/chl/gon/syphilis):  Recently separated  No new  Intimate partner violence:  feels safe, no concerns  Sexual History/Pain during Intercourse: separated x 2 months, no new sexual partners no pain or sx with sex, no concern for exposure to STD   Menstrual History/LMP/Abnormal Bleeding: bno AUB  Patient's last menstrual period was 08/22/2017.  Incontinence Symptoms: none   Breast cancer: due Last Mammogram: *see HM list above  Cervical cancer screening: UTD, due next year  Osteoporosis:   Discussion on osteoporosis per age, including high calcium and vitamin D supplementation, weight bearing exercises Pt is was on in the past, not currently on calcium/Vit D. No dexa done  Skin cancer:  Hx of skin CA -  NO Discussed atypical lesions  Skin tags, moles and bump under skin and skin tag on right upper eyelid, she declined dermatology referral today  Colorectal cancer:   some GERD sx and N/V Colonoscopy is due, never done - referred   Discussed concerning signs and sx of CRC She's had some GI upset, GERD, N/V and loose stool No blood in stool or abd pain  Lung cancer:   Low Dose CT Chest recommended if Age 33-80 years, 20 pack-year currently smoking OR have quit w/in 15years. Patient does not qualify.    Social History   Tobacco Use   Smoking status: Never   Smokeless tobacco: Never  Substance Use Topics   Alcohol use: No    Alcohol/week: 0.0 standard drinks of alcohol   Drug use: No     Flowsheet Row Office Visit from 08/08/2023 in [facility]  AUDIT-C Score 0    Family History  Problem Relation Age of Onset   Heart disease Brother    Heart disease Maternal Aunt    Diabetes Mother    Kidney disease Mother        died at 68 yo from kidney failure   Diabetes Maternal Aunt    Diabetes Maternal Uncle    Diabetes Maternal Grandmother    Breast cancer Maternal Grandmother    Breast cancer Maternal Aunt    Colon cancer Paternal Grandfather      Blood pressure/Hypertension: BP Readings from Last 3 Encounters:  10/19/23 128/84  08/08/23 132/82  06/08/23 125/85    Weight/Obesity: Wt Readings from Last 3 Encounters:  10/19/23 247 lb (112 kg)  08/08/23 256 lb (116.1 kg)  06/08/23 252 lb (114.3 kg)   BMI Readings from Last 3 Encounters:  10/19/23 38.69 kg/m  08/08/23 40.10 kg/m  06/08/23 39.47 kg/m     Lipids:  Lab Results  Component Value Date   CHOL 203 (H) 08/08/2023   CHOL 183 08/11/2016   CHOL 187 01/07/2016   Lab Results  Component Value Date   HDL 40 (L) 08/08/2023   HDL 41 (L) 08/11/2016   HDL 42 (L) 01/07/2016   Lab Results  Component Value Date   LDLCALC 135 (H) 08/08/2023   LDLCALC 125 (H) 08/11/2016   LDLCALC 120 01/07/2016   Lab Results  Component Value Date   TRIG 146 08/08/2023   TRIG 87 08/11/2016   TRIG 125 01/07/2016   Lab Results  Component Value Date   CHOLHDL  5.1 (H) 08/08/2023   CHOLHDL 4.5 08/11/2016   CHOLHDL 4.5 01/07/2016   No results found for: LDLDIRECT Based on the results of lipid panel his/her cardiovascular risk factor ( using Poole Cohort )  in the next 10 years is: The 10-year ASCVD risk score (Arnett DK, et al., 2019) is: 7.4%   Values used to  calculate the score:     Age: 74 years     Clincally relevant sex: Female     Is Non-Hispanic African American: Yes     Diabetic: No     Tobacco smoker: No     Systolic Blood Pressure: 128 mmHg     Is BP treated: Yes     HDL Cholesterol: 40 mg/dL     Total Cholesterol: 203 mg/dL  Glucose:  Glucose  Date Value Ref Range Status  12/09/2012 149 (H) 65 - 99 mg/dL Final  91/70/7985 874 (H) 65 - 99 mg/dL Final  96/87/7985 89 65 - 99 mg/dL Final   Glucose, Bld  Date Value Ref Range Status  08/08/2023 112 (H) 65 - 99 mg/dL Final    Comment:    .            Fasting reference interval . For someone without known diabetes, a glucose value between 100 and 125 mg/dL is consistent with prediabetes and should be confirmed with a follow-up test. .   06/08/2023 148 (H) 70 - 99 mg/dL Final    Comment:    Glucose reference range applies only to samples taken after fasting for at least 8 hours.  06/07/2023 145 (H) 70 - 99 mg/dL Final    Comment:    Glucose reference range applies only to samples taken after fasting for at least 8 hours.   Glucose-Capillary  Date Value Ref Range Status  12/22/2021 111 (H) 70 - 99 mg/dL Final    Comment:    Glucose reference range applies only to samples taken after fasting for at least 8 hours.  09/20/2017 78 65 - 99 mg/dL Final     Social History       Social History   Socioeconomic History   Marital status: Married    Spouse name: Not on file   Number of children: 4   Years of education: Not on file   Highest education level: Not on file  Occupational History   Occupation: CNA    Comment: Home [employer]  Tobacco Use   Smoking  status: Never   Smokeless tobacco: Never  Substance and Sexual Activity   Alcohol use: No    Alcohol/week: 0.0 standard drinks of alcohol   Drug use: No   Sexual activity: Yes    Partners: Male    Birth control/protection: None  Other Topics Concern   Not on file  Social History Narrative   Not on file   Social Drivers of Health   Financial Resource Strain: Low Risk  (08/08/2023)   Overall Financial Resource Strain (CARDIA)    Difficulty of Paying Living Expenses: Not hard at all  Food Insecurity: No Food Insecurity (08/08/2023)   Hunger Vital Sign    Worried About Running Out of Food in the Last Year: Never true    Ran Out of Food in the Last Year: Never true  Transportation Needs: No Transportation Needs (08/08/2023)   PRAPARE - Administrator, Civil Service (Medical): No    Lack of Transportation (Non-Medical): No  Physical Activity: Sufficiently Active (08/08/2023)   Exercise Vital Sign    Days of Exercise per Week: 3 days    Minutes of Exercise per Session: 60 min  Stress: No Stress Concern Present (08/08/2023)   [employer] of Occupational Health - Occupational Stress Questionnaire    Feeling of Stress : Only a little  Social Connections: Moderately Integrated (08/08/2023)   Social Connection and Isolation  Panel    Frequency of Communication with Friends and Family: More than three times a week    Frequency of Social Gatherings with Friends and Family: Three times a week    Attends Religious Services: More than 4 times per year    Active Member of Clubs or Organizations: No    Attends Banker Meetings: Never    Marital Status: Married    Family History        Family History  Problem Relation Age of Onset   Heart disease Brother    Heart disease Maternal Aunt    Diabetes Mother    Kidney disease Mother        died at 45 yo from kidney failure   Diabetes Maternal Aunt    Diabetes Maternal Uncle    Diabetes Maternal Grandmother     Breast cancer Maternal Grandmother    Breast cancer Maternal Aunt    Colon cancer Paternal Grandfather     Patient Active Problem List   Diagnosis Date Noted   Class 3 severe obesity with body mass index (BMI) of 40.0 to 44.9 in adult 08/08/2023   Vitamin D deficiency 08/08/2023   Prediabetes 09/16/2017   Microcytosis 09/16/2017   Elevated glucose 12/16/2015   Encounter for medication monitoring 12/16/2015   Abnormal mammography 09/26/2015   Toenail deformity 09/04/2015   Annual physical exam 06/03/2015   Encounter for cholesteral screening for cardiovascular disease 06/03/2015   Encounter for screening mammogram for malignant neoplasm of breast 06/03/2015   Encounter for screening for malignant neoplasm of cervix 06/03/2015   Breast mass, left 06/03/2015   Left knee pain 06/03/2015   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   Shortness of breath 05/21/2015   Morbid obesity (HCC) 05/21/2015   Irregular heart beat 03/26/2015   Abdominal pain 08/05/2014   Allergic rhinitis 08/05/2014   Routine general medical examination at a health care facility 08/05/2014   Gastro-esophageal reflux disease without esophagitis 08/05/2014   Genital herpes simplex type 2 08/05/2014   Headache, variant migraine 08/05/2014   Primary hypertension 08/05/2014   Asthma, mild intermittent 08/05/2014   Back pain, chronic 11/30/2006    Past Surgical History:  Procedure Laterality Date   CESAREAN SECTION  1991     Current Outpatient Medications:    albuterol  (PROAIR  HFA) 108 (90 Base) MCG/ACT inhaler, Inhale 1-2 puffs into the lungs every 4 (four) hours as needed for wheezing., Disp: 1 Inhaler, Rfl: 1   furosemide  (LASIX ) 20 MG tablet, Take 1 tablet (20 mg total) by mouth daily as needed., Disp: 30 tablet, Rfl: 3   lisinopril -hydrochlorothiazide  (ZESTORETIC ) 10-12.5 MG tablet, Take 1 tablet by mouth daily., Disp: , Rfl:    metoprolol succinate (TOPROL-XL) 25 MG 24 hr tablet, Take 12.5 mg by mouth daily., Disp: ,  Rfl:    omeprazole  (PRILOSEC) 20 MG capsule, Take 20 mg by mouth daily., Disp: , Rfl:   No Known Allergies  Patient Care Team: Toia Micale, PA-C as PCP - General (Family Medicine) Perla Evalene PARAS, MD as Consulting Physician (Cardiology) Hinton Newell SQUIBB, MD as Attending Physician (Family Medicine) Dellie Louanne MATSU, MD (General Surgery) Brannock, Wanda SQUIBB, RN   Chart Review: I personally reviewed active problem list, medication list, allergies, family history, social history, health maintenance, notes from last encounter, lab results, imaging with the patient/caregiver today.   Review of Systems  Constitutional: Negative.  Negative for activity change and appetite change.  HENT: Negative.    Eyes: Negative.   Respiratory: Negative.  Cardiovascular: Negative.   Gastrointestinal: Negative.   Endocrine: Negative.   Genitourinary: Negative.   Musculoskeletal: Negative.   Skin: Negative.   Allergic/Immunologic: Negative.   Neurological: Negative.   Hematological: Negative.   Psychiatric/Behavioral: Negative.    All other systems reviewed and are negative.         Objective:   Vitals:  Vitals:   10/19/23 1337  BP: 128/84  Pulse: 80  Resp: 16  SpO2: 95%  Weight: 247 lb (112 kg)  Height: 5' 7 (1.702 m)    Body mass index is 38.69 kg/m.  Physical Exam    Fall Risk:    10/19/2023    1:28 PM 08/08/2023    1:35 PM 08/11/2016    8:17 AM 09/04/2015    8:05 AM 03/26/2015   10:59 AM  Fall Risk   Falls in the past year? 0 0 No  No  No   Number falls in past yr: 0 0     Injury with Fall? 0 0     Risk for fall due to : No Fall Risks No Fall Risks     Follow up Falls prevention discussed Falls prevention discussed        Data saved with a previous flowsheet row definition    Functional Status Survey: Is the patient deaf or have difficulty hearing?: No Does the patient have difficulty seeing, even when wearing glasses/contacts?: No Does the patient have  difficulty concentrating, remembering, or making decisions?: No Does the patient have difficulty walking or climbing stairs?: No Does the patient have difficulty dressing or bathing?: No Does the patient have difficulty doing errands alone such as visiting a doctor's office or shopping?: No   Assessment & Plan:    CPE completed today  USPSTF grade A and B recommendations reviewed with patient; age-appropriate recommendations, preventive care, screening tests, etc discussed and encouraged; healthy living encouraged; see AVS for patient education given to patient  Discussed importance of 150 minutes of physical activity weekly, AHA exercise recommendations given to pt in AVS/handout  Discussed importance of healthy diet:  eating lean meats and proteins, avoiding trans fats and saturated fats, avoid simple sugars and excessive carbs in diet, eat 6 servings of fruit/vegetables daily and drink plenty of water and avoid sweet beverages.    Recommended pt to do annual eye exam and routine dental exams/cleanings  Depression, alcohol, fall screening completed as documented above and per flowsheets  Advance Care planning information and packet discussed and offered today, encouraged pt to discuss with family members/spouse/partner/friends and complete Advanced directive packet and bring copy to office   Reviewed Health Maintenance: Health Maintenance  Topic Date Due   Hepatitis C Screening  Never done   Colonoscopy  Never done   COVID-19 Vaccine (4 - 2024-25 season) 11/03/2023 (Originally 12/12/2022)   Zoster Vaccines- Shingrix (1 of 2) 01/18/2024 (Originally 04/25/2016)   DTaP/Tdap/Td (1 - Tdap) 10/17/2024 (Originally 04/25/1985)   Pneumococcal Vaccine 55-70 Years old (1 of 2 - PCV) 10/17/2024 (Originally 04/25/1985)   Hepatitis B Vaccines (1 of 3 - 19+ 3-dose series) 10/17/2024 (Originally 04/25/1985)   MAMMOGRAM  10/18/2024 (Originally 04/30/2023)   INFLUENZA VACCINE  11/11/2023   Cervical Cancer  Screening (HPV/Pap Cotest)  11/30/2024   HIV Screening  Completed   HPV VACCINES  Aged Out   Meningococcal B Vaccine  Aged Out    Immunizations: Immunization History  Administered Date(s) Administered   Influenza,inj,Quad PF,6+ Mos 01/26/2017   PFIZER(Purple Top)SARS-COV-2 Vaccination 06/23/2019, 07/14/2019, 03/24/2020  PPD Test 03/10/2021   Vaccines:  HPV: up to at age 17 , ask insurance if age between 19-45  Shingrix: 50-64 yo and ask insurance if covered when patient above 33 yo Pneumonia: DUE- educated and discussed with patient. Flu: not in season educated and discussed with patient.     ICD-10-CM   1. Well adult exam  Z00.00     2. Class 2 severe obesity with serious comorbidity and body mass index (BMI) of 38.0 to 38.9 in adult, unspecified obesity type (HCC)  Z33.187    Z68.38    E66.01     3. New onset type 2 diabetes mellitus (HCC)  E11.9 Blood Glucose Monitoring Suppl DEVI    Glucose Blood (BLOOD GLUCOSE TEST STRIPS) STRP    Lancets Misc. MISC    Referral to Nutrition and Diabetes Services    CANCELED: Referral to Nutrition and Diabetes Services    4. Screening for malignant neoplasm of colon  Z12.11 Ambulatory referral to Gastroenterology    CANCELED: Ambulatory referral to Gastroenterology    5. Encounter for screening mammogram for malignant neoplasm of breast  Z12.31 MM 3D SCREENING MAMMOGRAM BILATERAL BREAST     She complained of several acute sx - GERD, N/V sometimes vertigo Encouraged to come in for acute OV if sx persist Changed her prilosec to protonix  to see if that helps   Return in about 29 days (around 11/17/2023) for DM f/up, new DM, uncontrolled pt did not do f/up.      Michelene Cower, PA-C 10/19/23 1:50 PM  [facility]

## 2023-10-19 NOTE — Patient Instructions (Addendum)
 Health Maintenance  Topic Date Due   Hepatitis C Screening  Never done   Colon Cancer Screening  Never done   COVID-19 Vaccine (4 - 2024-25 season) 11/03/2023*   Zoster (Shingles) Vaccine (1 of 2) 01/18/2024*   DTaP/Tdap/Td vaccine (1 - Tdap) 10/17/2024*   Pneumococcal Vaccination (1 of 2 - PCV) 10/17/2024*   Hepatitis B Vaccine (1 of 3 - 19+ 3-dose series) 10/17/2024*   Mammogram  10/18/2024*   Flu Shot  11/11/2023   Pap with HPV screening  11/30/2024   HIV Screening  Completed   HPV Vaccine  Aged Out   Meningitis B Vaccine  Aged Out  *Topic was postponed. The date shown is not the original due date.

## 2023-11-03 ENCOUNTER — Telehealth: Payer: Self-pay

## 2023-11-03 ENCOUNTER — Other Ambulatory Visit: Payer: Self-pay | Admitting: Family Medicine

## 2023-11-03 MED ORDER — OMEPRAZOLE 40 MG PO CPDR: 40.0000 mg | DELAYED_RELEASE_CAPSULE | Freq: Every day | ORAL | 0 refills | Status: DC

## 2023-11-03 NOTE — Telephone Encounter (Signed)
 Copied from CRM [id number]. Topic: Clinical - Medication Question >> Nov 03, 2023 12:48 PM Nathanel BROCKS wrote: Reason for CRM: pt is calling about the pantoprazole  (PROTONIX ) 40 MG tablet that Dr Leavy put her on and she says this medication is not working and wants to go back on the omeprazole  (PRILOSEC) 40 MG capsule? She states that it worked way better than the pantoprazole . If ok would she please call it in at her pharmacy.    [id number] [place], [place] - [address] [[id number]]   Please advise

## 2023-11-25 ENCOUNTER — Ambulatory Visit: Admitting: Family Medicine

## 2023-11-28 ENCOUNTER — Other Ambulatory Visit: Payer: Self-pay | Admitting: Family Medicine

## 2023-12-02 ENCOUNTER — Other Ambulatory Visit: Payer: Self-pay | Admitting: Family Medicine

## 2023-12-05 ENCOUNTER — Ambulatory Visit: Payer: Self-pay

## 2023-12-05 NOTE — Telephone Encounter (Signed)
 FYI Only or Action Required?: FYI only for provider.  Pt has appt for tomorrow.   Patient was last seen in primary care on 10/19/23 with Michelene Leavy [facility] Nurse Triage reporting Dizziness.  Symptoms began several weeks ago.  Interventions attempted: Rest, hydration, or home remedies.  Symptoms are: unchanged.  Triage Disposition: See PCP When Office is Open (Within 3 Days)  Patient/caregiver understands and will follow disposition?: Yes     Copied from CRM #[id number]. Topic: Clinical - Red Word Triage >> Dec 05, 2023  9:26 AM Antwanette L wrote: Red Word that prompted transfer to Nurse Triage: Patient is experiencing dizziness and headaches Reason for Disposition  [1] MILD dizziness (e.g., walking normally) AND [2] has NOT been evaluated by doctor (or NP/PA) for this  (Exception: Dizziness caused by heat exposure, sudden standing, or poor fluid intake.)  Answer Assessment - Initial Assessment Questions 1. DESCRIPTION: Describe your dizziness.     woozy 2. LIGHTHEADED: Do you feel lightheaded? (e.g., somewhat faint, woozy, weak upon standing)     woozy 3. VERTIGO: Do you feel like either you or the room is spinning or tilting? (i.e., vertigo)     no 4. SEVERITY: How bad is it?  Do you feel like you are going to faint? Can you stand and walk?    Not right now, she did one time but she laid down and when she got up she felt better 5. ONSET:  When did the dizziness begin?     A couple weeks ago  6. AGGRAVATING FACTORS: Does anything make it worse? (e.g., standing, change in head position)     Standing up from sitting 7. HEART RATE: Can you tell me your heart rate? How many beats in 15 seconds?  (Note: Not all patients can do this.)       no 8. CAUSE: What do you think is causing the dizziness? (e.g., decreased fluids or food, diarrhea, emotional distress, heat exposure, new medicine, sudden standing, vomiting; unknown)     unknown 9. RECURRENT SYMPTOM: Have  you had dizziness before? If Yes, ask: When was the last time? What happened that time?     3 days ago 10. OTHER SYMPTOMS: Do you have any other symptoms? (e.g., fever, chest pain, vomiting, diarrhea, bleeding)       Nausea, headache, fatigue-  Protocols used: Dizziness - Lightheadedness-A-AH

## 2023-12-06 ENCOUNTER — Ambulatory Visit: Admitting: Family Medicine

## 2023-12-06 ENCOUNTER — Encounter: Payer: Self-pay | Admitting: Family Medicine

## 2023-12-06 VITALS — BP 136/84 | HR 61 | Resp 16 | Ht 67.0 in | Wt 252.0 lb

## 2023-12-06 DIAGNOSIS — K219 Gastro-esophageal reflux disease without esophagitis: Secondary | ICD-10-CM | POA: Diagnosis not present

## 2023-12-06 DIAGNOSIS — E119 Type 2 diabetes mellitus without complications: Secondary | ICD-10-CM | POA: Diagnosis not present

## 2023-12-06 LAB — POCT GLYCOSYLATED HEMOGLOBIN (HGB A1C): Hemoglobin A1C: 7.4 % — AB (ref 4.0–5.6)

## 2023-12-06 MED ORDER — OMEPRAZOLE 40 MG PO CPDR: 40.0000 mg | DELAYED_RELEASE_CAPSULE | Freq: Every day | ORAL | 1 refills | Status: AC

## 2023-12-06 MED ORDER — LISINOPRIL-HYDROCHLOROTHIAZIDE 10-12.5 MG PO TABS: 1.0000 | ORAL_TABLET | Freq: Every day | ORAL | 1 refills | Status: AC

## 2023-12-06 NOTE — Patient Instructions (Signed)
 Health Maintenance  Topic Date Due   Eye exam for diabetics  Never done   Yearly kidney health urinalysis for diabetes  Never done   COVID-19 Vaccine (4 - 2024-25 season) 12/10/2023*   Zoster (Shingles) Vaccine (1 of 2) 01/18/2024*   Flu Shot  07/10/2024*   DTaP/Tdap/Td vaccine (1 - Tdap) 10/17/2024*   Hepatitis B Vaccine (1 of 3 - 19+ 3-dose series) 10/17/2024*   Mammogram  10/18/2024*   Pneumococcal Vaccine for age over 95 (1 of 2 - PCV) 11/23/2024*   Colon Cancer Screening  12/05/2024*   Hepatitis C Screening  12/05/2024*   Hemoglobin A1C  02/07/2024   Yearly kidney function blood test for diabetes  08/07/2024   Pap with HPV screening  11/30/2024   Complete foot exam   12/05/2024   HIV Screening  Completed   HPV Vaccine  Aged Out   Meningitis B Vaccine  Aged Out  *Topic was postponed. The date shown is not the original due date.   Please ask your eye doctor to send us  a report of your diabetic eye exam results.

## 2023-12-06 NOTE — Progress Notes (Unsigned)
 Name: ONALEE STEINBACH   MRN: [id number]    DOB: 17-Sep-1965   Date:12/06/2023       Progress Note  Chief Complaint  Patient presents with   Diabetes     Subjective:   KARLY PITTER is a 58 y.o. female, presents to clinic for routine follow up on uncontrolled DM after labs from CPE in July  Lab Results  Component Value Date   HGBA1C 8.2 (H) 08/08/2023     Lab Results  Component Value Date   HGBA1C 8.2 (H) 08/08/2023   HGBA1C 6.0 (H) 08/11/2016   HGBA1C 5.8 (H) 01/07/2016   HGBA1C 6.1 (A) 05/18/2013   HGBA1C 6.2 12/09/2012     Discussed the use of AI scribe software for clinical note transcription with the patient, who gave verbal consent to proceed.  History of Present Illness TRINH SANJOSE is a 58 year old female with uncontrolled diabetes who presents for follow-up after abnormal lab results.  Hyperglycemia and diabetes management - Hemoglobin A1c increased to 8.2, indicating uncontrolled diabetes mellitus - Home blood glucose monitoring shows morning readings averaging in the 150s, with a recent high of 209 mg/dL - Blurry vision present, likely related to hyperglycemia - Dietary choices include consumption of Frosted Flakes, contributing to elevated blood glucose levels  Visual disturbances - Blurry vision associated with elevated blood glucose levels - Upcoming diabetic eye exam scheduled with Dr. Abigail in [place]  Medication management - Current medications include metoprolol, omeprazole , lisinopril , and hydrochlorothiazide  (HCTZ) - Omeprazole  dose recently increased to 40 mg with improved symptoms - Metoprolol dose increased to full dose - Concern about potential drug interactions with new medications     Current Outpatient Medications:    albuterol  (PROAIR  HFA) 108 (90 Base) MCG/ACT inhaler, Inhale 1-2 puffs into the lungs every 4 (four) hours as needed for wheezing., Disp: 1 Inhaler, Rfl: 1   Blood Glucose Monitoring Suppl DEVI, Dispense one  deviceglucometer per insurance coverage, Disp: 1 each, Rfl: 0   furosemide  (LASIX ) 20 MG tablet, Take 1 tablet (20 mg total) by mouth daily as needed., Disp: 30 tablet, Rfl: 3   Glucose Blood (BLOOD GLUCOSE TEST STRIPS) STRP, Use as directed with glucometer up to TID prn for blood sugar monitoring.  May substitute to any manufacturer covered by patient's insurance., Disp: 100 strip, Rfl: 3   Lancets Misc. MISC, Use as directed with glucometer up to TID prn for blood sugar monitoring.  May substitute to any manufacturer covered by patient's insurance., Disp: 100 each, Rfl: 3   lisinopril -hydrochlorothiazide  (ZESTORETIC ) 10-12.5 MG tablet, Take 1 tablet by mouth daily., Disp: , Rfl:    metoprolol succinate (TOPROL-XL) 25 MG 24 hr tablet, Take 12.5 mg by mouth daily. (Patient taking differently: Take 25 mg by mouth daily.), Disp: , Rfl:    omeprazole  (PRILOSEC) 40 MG capsule, Take 1 capsule (40 mg total) by mouth daily., Disp: 30 capsule, Rfl: 0  Patient Active Problem List   Diagnosis Date Noted   Class 3 severe obesity with body mass index (BMI) of 40.0 to 44.9 in adult 08/08/2023   Vitamin D deficiency 08/08/2023   Prediabetes 09/16/2017   Microcytosis 09/16/2017   Elevated glucose 12/16/2015   Encounter for medication monitoring 12/16/2015   Abnormal mammography 09/26/2015   Toenail deformity 09/04/2015   Annual physical exam 06/03/2015   Encounter for cholesteral screening for cardiovascular disease 06/03/2015   Encounter for screening mammogram for malignant neoplasm of breast 06/03/2015   Encounter for screening for  malignant neoplasm of cervix 06/03/2015   Breast mass, left 06/03/2015   Left knee pain 06/03/2015   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   Shortness of breath 05/21/2015   Morbid obesity (HCC) 05/21/2015   Irregular heart beat 03/26/2015   Abdominal pain 08/05/2014   Allergic rhinitis 08/05/2014   Routine general medical examination at a health care facility 08/05/2014    Gastro-esophageal reflux disease without esophagitis 08/05/2014   Genital herpes simplex type 2 08/05/2014   Headache, variant migraine 08/05/2014   Primary hypertension 08/05/2014   Asthma, mild intermittent 08/05/2014   Back pain, chronic 11/30/2006    Past Surgical History:  Procedure Laterality Date   CESAREAN SECTION  1991    Family History  Problem Relation Age of Onset   Heart disease Brother    Heart disease Maternal Aunt    Diabetes Mother    Kidney disease Mother        died at 81 yo from kidney failure   Diabetes Maternal Aunt    Diabetes Maternal Uncle    Diabetes Maternal Grandmother    Breast cancer Maternal Grandmother    Breast cancer Maternal Aunt    Colon cancer Paternal Grandfather     Social History   Tobacco Use   Smoking status: Never   Smokeless tobacco: Never  Substance Use Topics   Alcohol use: No    Alcohol/week: 0.0 standard drinks of alcohol   Drug use: No     No Known Allergies  Health Maintenance  Topic Date Due   OPHTHALMOLOGY EXAM  Never done   Diabetic kidney evaluation - Urine ACR  Never done   COVID-19 Vaccine (4 - 2024-25 season) 12/10/2023 (Originally 12/12/2022)   Zoster Vaccines- Shingrix (1 of 2) 01/18/2024 (Originally 04/25/2016)   INFLUENZA VACCINE  07/10/2024 (Originally 11/11/2023)   DTaP/Tdap/Td (1 - Tdap) 10/17/2024 (Originally 04/25/1985)   Hepatitis B Vaccines 19-59 Average Risk (1 of 3 - 19+ 3-dose series) 10/17/2024 (Originally 04/25/1985)   MAMMOGRAM  10/18/2024 (Originally 04/30/2023)   Pneumococcal Vaccine: 50+ Years (1 of 2 - PCV) 11/23/2024 (Originally 04/25/1985)   Colonoscopy  12/05/2024 (Originally 04/26/2011)   Hepatitis C Screening  12/05/2024 (Originally 04/25/1984)   HEMOGLOBIN A1C  02/07/2024   Diabetic kidney evaluation - eGFR measurement  08/07/2024   Cervical Cancer Screening (HPV/Pap Cotest)  11/30/2024   FOOT EXAM  12/05/2024   HIV Screening  Completed   HPV VACCINES  Aged Out   Meningococcal B  Vaccine  Aged Out    Chart Review Today: I personally reviewed active problem list, medication list, allergies, family history, social history, health maintenance, notes from last encounter, lab results, imaging with the patient/caregiver today.   Review of Systems   Objective:   Vitals:   12/06/23 1415  BP: 136/84  Pulse: 61  Resp: 16  SpO2: 96%  Weight: 252 lb (114.3 kg)  Height: 5' 7 (1.702 m)    Body mass index is 39.47 kg/m.  Physical Exam   Functional Status Survey:   Results for orders placed or performed in visit on 08/08/23  HgB A1c   Collection Time: 08/08/23  2:21 PM  Result Value Ref Range   Hgb A1c MFr Bld 8.2 (H) <5.7 %   Mean Plasma Glucose 189 mg/dL   eAG (mmol/L) 89.5 mmol/L  CBC with Differential/Platelet   Collection Time: 08/08/23  2:21 PM  Result Value Ref Range   WBC 6.4 3.8 - 10.8 Thousand/uL   RBC 4.88 3.80 - 5.10 Million/uL  Hemoglobin 12.3 11.7 - 15.5 g/dL   HCT 61.4 64.9 - 54.9 %   MCV 78.9 (L) 80.0 - 100.0 fL   MCH 25.2 (L) 27.0 - 33.0 pg   MCHC 31.9 (L) 32.0 - 36.0 g/dL   RDW 85.9 88.9 - 84.9 %   Platelets 281 140 - 400 Thousand/uL   MPV 10.7 7.5 - 12.5 fL   Neutro Abs 4,224 1,500 - 7,800 cells/uL   Absolute Lymphocytes 1,581 850 - 3,900 cells/uL   Absolute Monocytes 454 200 - 950 cells/uL   Eosinophils Absolute 109 15 - 500 cells/uL   Basophils Absolute 32 0 - 200 cells/uL   Neutrophils Relative % 66 %   Total Lymphocyte 24.7 %   Monocytes Relative 7.1 %   Eosinophils Relative 1.7 %   Basophils Relative 0.5 %  Comprehensive Metabolic Panel (CMET)   Collection Time: 08/08/23  2:21 PM  Result Value Ref Range   Glucose, Bld 112 (H) 65 - 99 mg/dL   BUN 8 7 - 25 mg/dL   Creat 9.30 9.49 - 8.96 mg/dL   eGFR 898 > OR = 60 fO/fpw/8.26f7   BUN/Creatinine Ratio SEE NOTE: 6 - 22 (calc)   Sodium 139 135 - 146 mmol/L   Potassium 4.2 3.5 - 5.3 mmol/L   Chloride 100 98 - 110 mmol/L   CO2 30 20 - 32 mmol/L   Calcium 9.5 8.6 - 10.4  mg/dL   Total Protein 7.7 6.1 - 8.1 g/dL   Albumin 4.3 3.6 - 5.1 g/dL   Globulin 3.4 1.9 - 3.7 g/dL (calc)   AG Ratio 1.3 1.0 - 2.5 (calc)   Total Bilirubin 0.5 0.2 - 1.2 mg/dL   Alkaline phosphatase (APISO) 76 37 - 153 U/L   AST 26 10 - 35 U/L   ALT 28 6 - 29 U/L  Lipid Profile   Collection Time: 08/08/23  2:21 PM  Result Value Ref Range   Cholesterol 203 (H) <200 mg/dL   HDL 40 (L) > OR = 50 mg/dL   Triglycerides 853 <849 mg/dL   LDL Cholesterol (Calc) 135 (H) mg/dL (calc)   Total CHOL/HDL Ratio 5.1 (H) <5.0 (calc)   Non-HDL Cholesterol (Calc) 163 (H) <130 mg/dL (calc)  Iron, TIBC and Ferritin Panel   Collection Time: 08/08/23  2:21 PM  Result Value Ref Range   Iron 70 45 - 160 mcg/dL   TIBC 681 749 - 549 mcg/dL (calc)   %SAT 22 16 - 45 % (calc)   Ferritin 261 (H) 16 - 232 ng/mL  TEST AUTHORIZATION   Collection Time: 08/08/23  2:21 PM  Result Value Ref Range   TEST NAME: IRON, TIBC AND FERRITIN PANEL    TEST CODE: [id number]    CLIENT CONTACT: Jaquelyne Firkus    REPORT ALWAYS MESSAGE SIGNATURE        Assessment & Plan:   Assessment and Plan Assessment & Plan Type 2 diabetes mellitus without complications Newly diagnosed with initial A1c of 8.2%, now 7.4%. Goal is A1c below 7.0%. - Discussed lifestyle modifications to further reduce A1c. She prefers lifestyle changes over medication at this time. - Discussed metformin  as a treatment option, including potential gastrointestinal side effects and gradual dose escalation. - Recheck A1c in 3 months. - Refer to diabetes educator and nutritionist for dietary and lifestyle guidance. - Perform urine test to check for diabetic kidney disease. - Perform diabetic foot exam. - Refer for diabetic eye exam. - Provide supplies for home blood glucose monitoring.  Primary hypertension Currently managed with metoprolol succinate and lisinopril -hydrochlorothiazide . No issues reported with regimen. - Continue current antihypertensive  regimen. - Discussed potential dizziness and lightheadedness with higher doses of metoprolol and option to consult cardiologist if symptoms persist. - Ensure lisinopril -hydrochlorothiazide  prescription is transferred and refilled as needed. - Adjust prescriptions to 90-day supply with refills.  Gastroesophageal reflux disease without esophagitis Managed with omeprazole . Symptoms improved with increased dose of 40 mg from 20 mg. - Continue omeprazole  40 mg daily. - Ensure omeprazole  prescription is refilled with a 90-day supply.  Recording duration: 22 minutes    New onset type 2 diabetes mellitus (HCC)   Pt for now will continue to work on diet for DM Improved from 8.2 to 7.4  Lab Results  Component Value Date   HGBA1C 7.4 (A) 12/06/2023  She wishes to stay off meds for now 3 month f/up Refer again to diabetes educator/nutritionist   Return in about 3 months (around 03/07/2024) for  DM A1c (uncontrolled) .    No follow-ups on file.   Michelene Cower, PA-C 12/06/23 2:25 PM

## 2023-12-07 ENCOUNTER — Encounter: Payer: Self-pay | Admitting: Family Medicine

## 2023-12-07 ENCOUNTER — Ambulatory Visit: Payer: Self-pay | Admitting: Family Medicine

## 2023-12-07 LAB — MICROALBUMIN / CREATININE URINE RATIO
Creatinine, Urine: 14 mg/dL — ABNORMAL LOW (ref 20–275)
Microalb Creat Ratio: 14 mg/g{creat} (ref <30)
Microalb, Ur: 0.2 mg/dL

## 2023-12-08 ENCOUNTER — Ambulatory Visit: Admitting: Family Medicine

## 2023-12-10 ENCOUNTER — Other Ambulatory Visit: Payer: Self-pay

## 2023-12-10 ENCOUNTER — Encounter (HOSPITAL_COMMUNITY): Payer: Self-pay

## 2023-12-10 ENCOUNTER — Ambulatory Visit (INDEPENDENT_AMBULATORY_CARE_PROVIDER_SITE_OTHER)

## 2023-12-10 ENCOUNTER — Ambulatory Visit (HOSPITAL_COMMUNITY)
Admission: RE | Admit: 2023-12-10 | Discharge: 2023-12-10 | Disposition: A | Discharge status: Home / Self Care | Payer: Self-pay | Source: Ambulatory Visit | Attending: Internal Medicine | Admitting: Internal Medicine

## 2023-12-10 ENCOUNTER — Ambulatory Visit (HOSPITAL_COMMUNITY)

## 2023-12-10 VITALS — BP 135/84 | HR 69 | Temp 98.2°F | Resp 18

## 2023-12-10 DIAGNOSIS — M25512 Pain in left shoulder: Secondary | ICD-10-CM

## 2023-12-10 MED ORDER — METHOCARBAMOL 500 MG PO TABS: 500.0000 mg | ORAL_TABLET | Freq: Three times a day (TID) | ORAL | 0 refills | Status: DC

## 2023-12-10 MED ORDER — MELOXICAM 15 MG PO TABS: 15.0000 mg | ORAL_TABLET | Freq: Every day | ORAL | 0 refills | Status: DC

## 2023-12-10 NOTE — ED Triage Notes (Signed)
 Pt c/o left arm pain for the past 3 days, using Tylenol  and Aleve with no relief. Pt denies any fall or inury.

## 2023-12-10 NOTE — Discharge Instructions (Signed)
 Your shoulder xray looks normal to me, but if the radiologist final reading shows something different, we will call you back. Please follow up  with your primary care next week.
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
## 2023-12-27 ENCOUNTER — Ambulatory Visit: Admitting: Dietician

## 2024-01-26 ENCOUNTER — Ambulatory Visit: Payer: Self-pay

## 2024-01-26 NOTE — Telephone Encounter (Signed)
 FYI Only or Action Required?: FYI only for provider.  Patient was last seen in primary care on 12/06/2023 by Leavy Mole, PA-C.  Called Nurse Triage reporting Breast Pain.  Symptoms began several years ago.  Interventions attempted: OTC medications: Tylenol .  Symptoms are: gradually worsening .  Triage Disposition: See PCP Within 2 Weeks  Patient/caregiver understands and will follow disposition?: Yes Copied from CRM [id number]. Topic: Clinical - Red Word Triage >> Jan 26, 2024 11:17 AM Paula Yang wrote: [facility] that prompted transfer to Nurse Triage: Patient states she felt something in her left breast and is having pain in her breast and her arm. Reason for Disposition  [1] Breast pain AND [2] cause is not known  Answer Assessment - Initial Assessment Questions Ongoing breast tenderness for years. This morning it feels more tender during self exam, spongey, denies lump. Denies fever, swelling to the breast, or any hot hard lumps. Wears supportive bra, she cannot wear under wire due to sensitivity. Tylenol  takes the edge off. Denies feeling like hormone fluctuation. She is overdue for a mammogram but needs a new referral for testing.Appt made. ED/UC/Call back precautions given   1. SYMPTOM: What's the main symptom you're concerned about?  (e.g., lump, nipple discharge, pain, rash)     Breast tenderness 2. LOCATION: Where is the breast tenderness located?     Left breast on the underside  3. ONSET: When did tenderness  start?     Feels more sore than usual 4. PRIOR HISTORY: Do you have any history of prior problems with your breasts? (e.g., breast cancer, breast implant, fibrocystic breast disease)     Grandmother and aunt with Breast CA hx.  5. CAUSE: What do you think is causing this symptom?     Unsure if radiating from her shoulder arthritis 6. OTHER SYMPTOMS: Do you have any other symptoms? (e.g., breast pain, fever, nipple discharge, redness or rash)     On going  Left arm/shoulder pain- likely from her arthritis  Protocols used: Breast Symptoms-A-AH

## 2024-01-27 ENCOUNTER — Ambulatory Visit: Admitting: Internal Medicine

## 2024-01-30 ENCOUNTER — Ambulatory Visit: Admitting: Family Medicine

## 2024-02-03 ENCOUNTER — Ambulatory Visit: Admitting: Dietician

## 2024-02-21 ENCOUNTER — Ambulatory Visit: Payer: Self-pay | Admitting: *Deleted

## 2024-02-21 NOTE — Telephone Encounter (Signed)
 FYI Only or Action Required?: FYI only for provider: appointment scheduled on 11/13.  Patient was last seen in primary care on 12/06/2023 by Leavy Mole, PA-C.  Called Nurse Triage reporting Ear Fullness (Feels like fluid in ears, difficulty hearing).  Symptoms began several weeks ago.  Interventions attempted: Nothing.  Symptoms are: gradually worsening.  Triage Disposition: See PCP When Office is Open (Within 3 Days)  Patient/caregiver understands and will follow disposition?: yes  Copied from CRM #[id number]. Topic: Clinical - Red Word Triage >> Feb 21, 2024  9:59 AM Wess RAMAN wrote: Red Word that prompted transfer to Nurse Triage: Ears feel like it is filled with fluid and difficulty hearing. Sometimes painful Reason for Disposition  [1] Ear congestion lasts > 3 days AND [2] no improvement after using Care Advice  (Exception: Ear congestion is a chronic symptom.)  Answer Assessment - Initial Assessment Questions 1. LOCATION: Which ear is involved?       Both ears- worse on R side 2. SENSATION: Describe how the ear feels. (e.g., stuffy, full, plugged).      Feels like fluid is there- like talking under water 3. ONSET:  When did the ear symptoms start?       Started 2 weeks ago- getting worse 4. PAIN: Do you also have an earache? If Yes, ask: How bad is it? (Scale 0-10; none, mild, moderate or severe)     3-4/10 5. CAUSE: What do you think is causing the ear congestion? (e.g., common cold, nasal allergies, recent flight, recent snorkeling)     Possible wax build up or fluid 6. OTHER SYMPTOMS: Do you have any other symptoms? (e.g., ear drainage, hay fever symptoms such as sneezing or a clear nasal discharge; cold symptoms such as a cough or runny nose)     allergies  Protocols used: Ear - Congestion-A-AH

## 2024-02-22 NOTE — Progress Notes (Deleted)
   LMP 08/22/2017    Subjective:    Patient ID: DEE PADEN, female    DOB: [birth year], 58 y.o.   MRN: [id number]  HPI: RORI GOAR is a 58 y.o. female presenting today with bilateral ear pressure that began 2 weeks ago and is worsening. She endorses that the right ear is worse than the left. She reports that her ears feel like 'there is fluid in them.            10/19/2023    1:28 PM 08/08/2023    1:35 PM 08/11/2016    8:17 AM  Depression screen PHQ 2/9  Decreased Interest 0 0 0  Down, Depressed, Hopeless 0 0 1  PHQ - 2 Score 0 0 1  Altered sleeping  0   Tired, decreased energy  0   Change in appetite  0   Feeling bad or failure about yourself   0   Trouble concentrating  0   Moving slowly or fidgety/restless  0   Suicidal thoughts  0   PHQ-9 Score  0       Data saved with a previous flowsheet row definition    Relevant past medical, surgical, family and social history reviewed and updated as indicated. Interim medical history since our last visit reviewed. Allergies and medications reviewed and updated.  Review of Systems  Per HPI unless specifically indicated above     Objective:     LMP 08/22/2017   {Vitals History (Optional):23777} Wt Readings from Last 3 Encounters:  12/06/23 252 lb (114.3 kg)  10/19/23 247 lb (112 kg)  08/08/23 256 lb (116.1 kg)    Physical Exam   Results for orders placed or performed in visit on 12/06/23  POCT HgB A1C   Collection Time: 12/06/23  2:52 PM  Result Value Ref Range   Hemoglobin A1C 7.4 (A) 4.0 - 5.6 %   HbA1c POC (<> result, manual entry)     HbA1c, POC (prediabetic range)     HbA1c, POC (controlled diabetic range)    Urine Microalbumin w/creat. ratio   Collection Time: 12/06/23  2:56 PM  Result Value Ref Range   Creatinine, Urine 14 (L) 20 - 275 mg/dL   Microalb, Ur 0.2 mg/dL   Microalb Creat Ratio 14 <30 mg/g creat   {Labs (Optional):23779}       Assessment & Plan:   Problem List Items  Addressed This Visit   None    Assessment and Plan         Follow up plan: No follow-ups on file.

## 2024-02-23 ENCOUNTER — Encounter: Payer: Self-pay | Admitting: Internal Medicine

## 2024-02-23 ENCOUNTER — Ambulatory Visit: Admitting: Internal Medicine

## 2024-02-23 VITALS — BP 124/82 | HR 88 | Temp 98.0°F | Resp 18 | Ht 67.0 in | Wt 252.9 lb

## 2024-02-23 DIAGNOSIS — J301 Allergic rhinitis due to pollen: Secondary | ICD-10-CM | POA: Diagnosis not present

## 2024-02-23 DIAGNOSIS — H6993 Unspecified Eustachian tube disorder, bilateral: Secondary | ICD-10-CM

## 2024-02-23 MED ORDER — FLUTICASONE PROPIONATE 50 MCG/ACT NA SUSP: 2.0000 | Freq: Every day | NASAL | 6 refills | Status: AC

## 2024-02-23 MED ORDER — FEXOFENADINE HCL 30 MG PO TBDP: 30.0000 mg | ORAL_TABLET | Freq: Every day | ORAL | 1 refills | Status: AC

## 2024-02-23 NOTE — Progress Notes (Signed)
 Acute Office Visit  Subjective:     Patient ID: Paula Yang, female    DOB: [birth year], 58 y.o.   MRN: [id number]  Chief Complaint  Patient presents with   Ear Fullness    Pressure/pain    HPI Patient is in today for bilateral ear pain/fullness.   Discussed the use of AI scribe software for clinical note transcription with the patient, who gave verbal consent to proceed.  History of Present Illness Paula Yang is a 58 year old female who presents with ear discomfort and tinnitus.  She experiences discomfort in both ears, more pronounced in the right ear, persisting for two weeks and worsening over the past weekend. She describes a sensation of clogged ears and an echo when speaking, akin to being underwater. Tinnitus has been present in both ears for three to four weeks. There is no ear drainage. She denies upper respiratory symptoms such as congestion, sinus pain, or sore throat, aside from her usual allergy symptoms. Her allergies are managed with Claritin when affordable. She was previously prescribed Flonase  but is not currently using it. No fever or other upper respiratory symptoms are present, aside from her regular allergy symptoms.   Review of Systems  Constitutional:  Negative for chills and fever.  HENT:  Positive for ear pain and tinnitus. Negative for congestion, ear discharge, hearing loss and sore throat.   Respiratory:  Negative for cough.         Objective:    BP 124/82   Pulse 88   Temp 98 F (36.7 C)   Resp 18   Ht 5' 7 (1.702 m)   Wt 252 lb 14.4 oz (114.7 kg)   LMP 08/22/2017   SpO2 98%   BMI 39.61 kg/m  BP Readings from Last 3 Encounters:  02/23/24 124/82  12/10/23 135/84  12/06/23 136/84   Wt Readings from Last 3 Encounters:  02/23/24 252 lb 14.4 oz (114.7 kg)  12/06/23 252 lb (114.3 kg)  10/19/23 247 lb (112 kg)      Physical Exam Constitutional:      Appearance: Normal appearance.  HENT:     Head: Normocephalic and  atraumatic.     Right Ear: Ear canal and external ear normal.     Left Ear: Ear canal and external ear normal.     Ears:     Comments: Fluid behind both ears, worse on the right    Nose: Nose normal.     Mouth/Throat:     Mouth: Mucous membranes are moist.     Pharynx: Oropharynx is clear.  Eyes:     Conjunctiva/sclera: Conjunctivae normal.  Cardiovascular:     Rate and Rhythm: Normal rate and regular rhythm.  Pulmonary:     Effort: Pulmonary effort is normal.     Breath sounds: Normal breath sounds.  Skin:    General: Skin is warm and dry.  Neurological:     General: No focal deficit present.     Mental Status: She is alert. Mental status is at baseline.  Psychiatric:        Mood and Affect: Mood normal.        Behavior: Behavior normal.     No results found for any visits on 02/23/24.      Assessment & Plan:   Assessment and Plan Assessment & Plan Eustachian tube dysfunction with bilateral middle ear effusion Bilateral middle ear effusion with more fluid on the right side, causing fullness and echo. No infection  or earwax obstruction. Likely due to sinus pressure from allergic rhinitis. - Prescribed Flonase  nasal spray, two sprays each nostril twice daily during symptoms. - Recommended nasal saline spray, especially in the morning. - Advised Allegra for reducing allergic reaction and sinus pressure.  Allergic rhinitis due to pollen Chronic allergic rhinitis with daily symptoms worsened by pollen season. Symptoms include nasal congestion, sinus pressure, and ear fullness. Insurance coverage for Allegra and Flonase  discussed. - Prescribed Allegra pending insurance approval. - Advised Flonase  nasal spray for sinus inflammation. - Recommended nasal saline spray for relief. - Provided GoodRx card for medication cost savings.  - fexofenadine (ALLEGRA ODT) 30 MG disintegrating tablet; Take 1 tablet (30 mg total) by mouth daily.  Dispense: 90 tablet; Refill: 1 - fluticasone   (FLONASE ) 50 MCG/ACT nasal spray; Place 2 sprays into both nostrils daily.  Dispense: 16 g; Refill: 6   Return if symptoms worsen or fail to improve.  Sharyle Fischer, DO

## 2024-03-05 ENCOUNTER — Other Ambulatory Visit: Payer: Self-pay | Admitting: Family Medicine
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
## 2024-03-06 NOTE — Telephone Encounter (Signed)
 Requested medication (s) are due for refill today - expired Rx  Requested medication (s) are on the active medication list -yes  Future visit scheduled -yes  Last refill: 07/14/17 1inhaler 1RF  Notes to clinic: expired Rx, provider no longer at office  Requested Prescriptions  Pending Prescriptions Disp Refills   albuterol  (PROAIR  HFA) 108 (90 Base) MCG/ACT inhaler 1 each 1    Sig: Inhale 1-2 puffs into the lungs every 4 (four) hours as needed for wheezing.     Pulmonology:  Beta Agonists 2 Passed - 03/06/2024  3:10 PM      Passed - Last BP in normal range    BP Readings from Last 1 Encounters:  02/23/24 124/82         Passed - Last Heart Rate in normal range    Pulse Readings from Last 1 Encounters:  02/23/24 88         Passed - Valid encounter within last 12 months    Recent Outpatient Visits           1 week ago Dysfunction of both eustachian tubes   [facility] Bernardo Fend, DO   3 months ago New onset type 2 diabetes mellitus [facility])   [place] [facility] Leavy Mole, PA-C   4 months ago Well adult exam   [facility] Leavy Mole, PA-C   7 months ago Encounter for medical examination to establish care   [facility] Leavy Mole, PA-C                 Requested Prescriptions  Pending Prescriptions Disp Refills   albuterol  (PROAIR  HFA) 108 (90 Base) MCG/ACT inhaler 1 each 1    Sig: Inhale 1-2 puffs into the lungs every 4 (four) hours as needed for wheezing.     Pulmonology:  Beta Agonists 2 Passed - 03/06/2024  3:10 PM      Passed - Last BP in normal range    BP Readings from Last 1 Encounters:  02/23/24 124/82         Passed - Last Heart Rate in normal range    Pulse Readings from Last 1 Encounters:  02/23/24 88         Passed - Valid encounter within last 12 months    Recent Outpatient Visits           1 week ago Dysfunction of both  eustachian tubes   [facility] Bernardo Fend, DO   3 months ago New onset type 2 diabetes mellitus [facility])   [facility] Leavy Mole, PA-C   4 months ago Well adult exam   [facility] Leavy Mole, PA-C   7 months ago Encounter for medical examination to establish care   [facility] Leavy Mole, PA-C

## 2024-03-07 ENCOUNTER — Ambulatory Visit: Admitting: Family Medicine

## 2024-03-07 ENCOUNTER — Ambulatory Visit: Admitting: Nurse Practitioner

## 2024-03-14 ENCOUNTER — Other Ambulatory Visit: Payer: Self-pay | Admitting: Family Medicine

## 2024-03-14 ENCOUNTER — Ambulatory Visit: Payer: Self-pay
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
## 2024-03-15 ENCOUNTER — Ambulatory Visit: Admitting: Physician Assistant

## 2024-03-15 MED ORDER — ALBUTEROL SULFATE HFA 108 (90 BASE) MCG/ACT IN AERS: 1.0000 | INHALATION_SPRAY | RESPIRATORY_TRACT | 0 refills | Status: AC | PRN

## 2024-03-27 ENCOUNTER — Ambulatory Visit: Admitting: Nurse Practitioner

## 2024-04-02 ENCOUNTER — Ambulatory Visit: Admitting: Family Medicine

## 2024-04-04 ENCOUNTER — Ambulatory Visit: Admitting: Family Medicine

## 2024-04-10 ENCOUNTER — Ambulatory Visit: Admitting: Family Medicine

## 2024-04-10 ENCOUNTER — Other Ambulatory Visit: Payer: Self-pay | Admitting: Family Medicine

## 2024-04-10 ENCOUNTER — Encounter: Payer: Self-pay | Admitting: Family Medicine

## 2024-04-10 VITALS — BP 128/70 | HR 89 | Resp 16 | Ht 67.0 in | Wt 251.0 lb

## 2024-04-10 DIAGNOSIS — E559 Vitamin D deficiency, unspecified: Secondary | ICD-10-CM | POA: Diagnosis not present

## 2024-04-10 DIAGNOSIS — M25512 Pain in left shoulder: Secondary | ICD-10-CM

## 2024-04-10 DIAGNOSIS — E782 Mixed hyperlipidemia: Secondary | ICD-10-CM

## 2024-04-10 DIAGNOSIS — G8929 Other chronic pain: Secondary | ICD-10-CM

## 2024-04-10 DIAGNOSIS — E119 Type 2 diabetes mellitus without complications: Secondary | ICD-10-CM

## 2024-04-10 DIAGNOSIS — E1165 Type 2 diabetes mellitus with hyperglycemia: Secondary | ICD-10-CM

## 2024-04-10 DIAGNOSIS — Z1231 Encounter for screening mammogram for malignant neoplasm of breast: Secondary | ICD-10-CM

## 2024-04-10 DIAGNOSIS — I10 Essential (primary) hypertension: Secondary | ICD-10-CM

## 2024-04-10 DIAGNOSIS — J4520 Mild intermittent asthma, uncomplicated: Secondary | ICD-10-CM

## 2024-04-10 MED ORDER — FUROSEMIDE 20 MG PO TABS: 20.0000 mg | ORAL_TABLET | Freq: Every day | ORAL | 2 refills | Status: AC | PRN

## 2024-04-10 NOTE — Assessment & Plan Note (Addendum)
 Mammogram not UTD. Previously ordered by last PCP in 10/2023 however patient states she missed this appointment and voices she was told she would need a new referral.  -Mammogram ordered.  Orders:   MM 3D SCREENING MAMMOGRAM BILATERAL BREAST; Future

## 2024-04-10 NOTE — Assessment & Plan Note (Addendum)
 Documented hx of vitamin D deficiency without recent vitamin D level. Due to left shoulder pain x 2 to 3 years and noted hx of vitamin D deficiency, will obtain vitamin D level.  Orders:   Vitamin D (25 hydroxy)

## 2024-04-10 NOTE — Assessment & Plan Note (Addendum)
 Asthma stable. Patient denies shortness of breath. She denies frequent use of albuterol  inhaler.  V/s stable. All lung fields clear to auscultation She states she does not need Albuterol  refill at this time.

## 2024-04-10 NOTE — Progress Notes (Signed)
 "  Established Patient Office Visit  Subjective   Patient ID: Paula Yang, female    DOB: 11-23-1965  Age: 58 y.o. MRN: [id number]  Chief Complaint  Patient presents with   Medical Management of Chronic Issues   Diabetes    HPI  Paula Yang is a pleasant 58 year old female who presents for chronic care management, following up on diabetes. Previously seen in 11/2023 by prior PCP for type 2 diabetes follow up. She confirms. They had discussed lifestyle changes including dietary changes for management of diabetes. 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 She admits that snacks are often not compliant with a diabetic friendly diet. She is hopeful that her A1c has improved.   Medications reviewed and I question if she still takes Metoprolol. She confirms she is taking medication. She follows with cardiology and voices they prescribe Metoprolol . Metoprolol for management of PVCs. She voices she had been without Metoprolol before and experienced a lot of palpitations and it has been better since resuming, but she voices recently with increased stress she has experienced more palpitations. She does endorse intermittent of chest pain lasting only a few seconds. She is asymptomatic at time of visit today, denying chest pain or palpitations.   She voices due to leg swelling she has had to take Lasix  more often. She asks that Lasix  is refilled.    She complains of left arm pain starting 3 years ago. She voices left shoulder pain that sometimes radiates down the left arm. She voices she feels that sometimes her left arm swells. She has limited active range of motion at time of visit today due to discomfort. She voices she has not seen ortho for evaluation of left arm and left shoulder pain but is agreeable. Will place referral today.   Review of Systems  Respiratory:  Negative for shortness of breath.   Cardiovascular:   Positive for chest pain, palpitations and leg swelling.  Musculoskeletal:  Positive for joint pain. Negative for falls.      Objective:     BP 128/70   Pulse 89   Resp 16   Ht 5' 7 (1.702 m)   Wt 251 lb (113.9 kg)   LMP 08/22/2017   SpO2 99%   BMI 39.31 kg/m  BP Readings from Last 3 Encounters:  04/10/24 128/70  02/23/24 124/82  12/10/23 135/84   Wt Readings from Last 3 Encounters:  04/10/24 251 lb (113.9 kg)  02/23/24 252 lb 14.4 oz (114.7 kg)  12/06/23 252 lb (114.3 kg)      Physical Exam Constitutional:      General: She is not in acute distress.    Appearance: She is obese.  HENT:     Head: Normocephalic.  Cardiovascular:     Rate and Rhythm: Normal rate and regular rhythm.     Heart sounds: Normal heart sounds.  Pulmonary:     Effort: Pulmonary effort is normal. No respiratory distress.     Breath sounds: Normal breath sounds.  Musculoskeletal:     Right lower leg: No edema.     Left lower leg: No edema.  Skin:    General: Skin is warm and dry.  Neurological:     General: No focal deficit present.     Mental Status: She is alert.  Psychiatric:        Mood and Affect: Mood normal.  Behavior: Behavior normal.    Last CBC Lab Results  Component Value Date   WBC 6.4 08/08/2023   HGB 12.3 08/08/2023   HCT 38.5 08/08/2023   MCV 78.9 (L) 08/08/2023   MCH 25.2 (L) 08/08/2023   RDW 14.0 08/08/2023   PLT 281 08/08/2023   Last metabolic panel Lab Results  Component Value Date   GLUCOSE 112 (H) 08/08/2023   NA 139 08/08/2023   K 4.2 08/08/2023   CL 100 08/08/2023   CO2 30 08/08/2023   BUN 8 08/08/2023   CREATININE 0.69 08/08/2023   EGFR 101 08/08/2023   CALCIUM 9.5 08/08/2023   PROT 7.7 08/08/2023   ALBUMIN 4.2 06/08/2023   BILITOT 0.5 08/08/2023   ALKPHOS 65 06/08/2023   AST 26 08/08/2023   ALT 28 08/08/2023   ANIONGAP 7 06/08/2023   Last lipids Lab Results  Component Value Date   CHOL 203 (H) 08/08/2023   HDL 40 (L) 08/08/2023    LDLCALC 135 (H) 08/08/2023   TRIG 146 08/08/2023   CHOLHDL 5.1 (H) 08/08/2023   Last hemoglobin A1c Lab Results  Component Value Date   HGBA1C 7.4 (A) 12/06/2023       Assessment & Plan:   Assessment & Plan Type 2 diabetes mellitus with hyperglycemia, without long-term current use of insulin (HCC) Newly diagnosed type 2 diabetes this year. Management of type 2 diabetes currently includes lifestyle modifications. Type 2 DM uncontrolled with last A1c of 7.4 in 11/2023, however previously 8.2 in 07/2023.  -Reinforced importance of lifestyle modifications including dietary changes and exercise. Discussed snack options that are compliant with a diabetic friendly diet and written education provided at end of visit with AVS -Discussed medication management for type 2 DM. She states she prefers oral medications, voicing she is afraid of needles.  -Previously discussed Metformin  for management of type 2 DM at her last visit with her previous PCP -Will schedule 1 month f/u to discuss labs and next steps for plan of care. She is agreeable. Orders:   HgB A1c   Comprehensive Metabolic Panel (CMET)  Primary hypertension HTN stable. BP at goal. BP of 128/70 at today's visit.   -Continue Lisinopril -hydrochlorothiazide  10-12.5mg  once daily. She denies refills at time of visit -Continue  Metoprolol 12.5mg  daily as directed by cardiology -Continue Lasix  20mg  once daily as needed for leg swelling  Orders:   Comprehensive Metabolic Panel (CMET)   furosemide  (LASIX ) 20 MG tablet; Take 1 tablet (20 mg total) by mouth daily as needed (leg swelling).  Mixed hyperlipidemia Hyperlipidemia previously uncontrolled, last LDL of 135 07/2023  -Update lipid profile -Encouraged dietary changes and exercise for management of hyperlipidemia at this time -Plan for 1 month f/u to review labs and discuss next steps for plan of care. She is receptive.  Orders:   Lipid Profile  Chronic left shoulder  pain Chronic left shoulder pain as per HPI. She denies hx of falls or other injuries. Will refer to ortho for further evaluation. She is receptive.  Orders:   Ambulatory referral to Orthopedic Surgery  Mild intermittent asthma without complication Asthma stable. Patient denies shortness of breath. She denies frequent use of albuterol  inhaler.  V/s stable. All lung fields clear to auscultation She states she does not need Albuterol  refill at this time.     Vitamin D deficiency Documented hx of vitamin D deficiency without recent vitamin D level. Due to left shoulder pain x 2 to 3 years and noted hx of vitamin D deficiency, will  obtain vitamin D level.  Orders:   Vitamin D (25 hydroxy)  Encounter for screening mammogram for malignant neoplasm of breast Mammogram not UTD. Previously ordered by last PCP in 10/2023 however patient states she missed this appointment and voices she was told she would need a new referral.  -Mammogram ordered.  Orders:   MM 3D SCREENING MAMMOGRAM BILATERAL BREAST; Future      Return in about 1 month (around 05/11/2024).    Paula LOISE CORE, FNP "

## 2024-04-10 NOTE — Assessment & Plan Note (Addendum)
 HTN stable. BP at goal. BP of 128/70 at today's visit.   -Continue Lisinopril -hydrochlorothiazide  10-12.5mg  once daily. She denies refills at time of visit -Continue  Metoprolol 12.5mg  daily as directed by cardiology -Continue Lasix  20mg  once daily as needed for leg swelling  Orders:   Comprehensive Metabolic Panel (CMET)   furosemide  (LASIX ) 20 MG tablet; Take 1 tablet (20 mg total) by mouth daily as needed (leg swelling).

## 2024-04-10 NOTE — Telephone Encounter (Unsigned)
 Copied from CRM [id number]. Topic: Clinical - Medication Refill >> Apr 10, 2024 12:09 PM Darshell M wrote: Medication:  Glucose Blood (BLOOD GLUCOSE TEST STRIPS) STRP    Has the patient contacted their pharmacy? Yes (Agent: If no, request that the patient contact the pharmacy for the refill. If patient does not wish to contact the pharmacy document the reason why and proceed with request.) (Agent: If yes, when and what did the pharmacy advise?)  This is the patient's preferred pharmacy:  [facility] [phone] [place], [place] - [address] [place] [address] Phone: [phone] Fax: [phone]  Is this the correct pharmacy for this prescription? Yes If no, delete pharmacy and type the correct one.   Has the prescription been filled recently? No  Is the patient out of the medication? Yes  Has the patient been seen for an appointment in the last year OR does the patient have an upcoming appointment? Yes  Can we respond through MyChart? Yes  Agent: Please be advised that Rx refills may take up to 3 business days. We ask that you follow-up with your pharmacy.

## 2024-04-11 ENCOUNTER — Ambulatory Visit: Payer: Self-pay | Admitting: Family Medicine

## 2024-04-11 ENCOUNTER — Telehealth: Payer: Self-pay

## 2024-04-11 DIAGNOSIS — E782 Mixed hyperlipidemia: Secondary | ICD-10-CM

## 2024-04-11 DIAGNOSIS — E1165 Type 2 diabetes mellitus with hyperglycemia: Secondary | ICD-10-CM

## 2024-04-11 LAB — COMPREHENSIVE METABOLIC PANEL WITH GFR
AG Ratio: 1.3 (calc) (ref 1.0–2.5)
ALT: 26 U/L (ref 6–29)
AST: 23 U/L (ref 10–35)
Albumin: 4.1 g/dL (ref 3.6–5.1)
Alkaline phosphatase (APISO): 84 U/L (ref 37–153)
BUN: 10 mg/dL (ref 7–25)
CO2: 28 mmol/L (ref 20–32)
Calcium: 9 mg/dL (ref 8.6–10.4)
Chloride: 101 mmol/L (ref 98–110)
Creat: 0.69 mg/dL (ref 0.50–1.03)
Globulin: 3.2 g/dL (ref 1.9–3.7)
Glucose, Bld: 183 mg/dL — ABNORMAL HIGH (ref 65–99)
Potassium: 4.2 mmol/L (ref 3.5–5.3)
Sodium: 136 mmol/L (ref 135–146)
Total Bilirubin: 0.3 mg/dL (ref 0.2–1.2)
Total Protein: 7.3 g/dL (ref 6.1–8.1)
eGFR: 101 mL/min/1.73m2

## 2024-04-11 LAB — LIPID PANEL
Cholesterol: 205 mg/dL — ABNORMAL HIGH
HDL: 38 mg/dL — ABNORMAL LOW
LDL Cholesterol (Calc): 136 mg/dL — ABNORMAL HIGH
Non-HDL Cholesterol (Calc): 167 mg/dL — ABNORMAL HIGH
Total CHOL/HDL Ratio: 5.4 (calc) — ABNORMAL HIGH
Triglycerides: 175 mg/dL — ABNORMAL HIGH

## 2024-04-11 LAB — EXTRA

## 2024-04-11 LAB — VITAMIN D 25 HYDROXY (VIT D DEFICIENCY, FRACTURES): Vit D, 25-Hydroxy: 26 ng/mL — ABNORMAL LOW (ref 30–100)

## 2024-04-11 LAB — HEMOGLOBIN A1C
Hgb A1c MFr Bld: 7.9 % — ABNORMAL HIGH
Mean Plasma Glucose: 180 mg/dL
eAG (mmol/L): 10 mmol/L

## 2024-04-11 MED ORDER — ROSUVASTATIN CALCIUM 10 MG PO TABS: 10.0000 mg | ORAL_TABLET | Freq: Every day | ORAL | 0 refills | Status: AC

## 2024-04-11 MED ORDER — METFORMIN HCL ER 500 MG PO TB24: 500.0000 mg | ORAL_TABLET | Freq: Every evening | ORAL | 0 refills | Status: AC

## 2024-04-11 NOTE — Telephone Encounter (Signed)
 Copied from CRM #[id number]. Topic: Clinical - Medication Question >> Apr 11, 2024  2:39 PM Avram G wrote: Reason for CRM: patient would like to know what time she should take rosuvastatin (CRESTOR) 10 MG tablet [[id number]] she works the third shift. Please advise  [phone]

## 2024-04-12 MED ORDER — BLOOD GLUCOSE TEST VI STRP: ORAL_STRIP | 3 refills | Status: AC

## 2024-04-12 NOTE — Telephone Encounter (Signed)
 Requested Prescriptions  Pending Prescriptions Disp Refills   Glucose Blood (BLOOD GLUCOSE TEST STRIPS) STRP 100 strip 3    Sig: Use as directed with glucometer up to TID prn for blood sugar monitoring.  May substitute to any manufacturer covered by patient's insurance.     Endocrinology: Diabetes - Testing Supplies Passed - 04/12/2024  8:10 AM      Passed - Valid encounter within last 12 months    Recent Outpatient Visits           2 days ago Type 2 diabetes mellitus with hyperglycemia, without long-term current use of insulin [facility])   [place] [facility] [place], Laymon SAILOR, FNP   1 month ago Dysfunction of both eustachian tubes   [facility] Bernardo Fend, DO   4 months ago New onset type 2 diabetes mellitus [facility])   [place] [facility] Leavy Mole, PA-C   5 months ago Well adult exam   [facility] Leavy Mole, PA-C   8 months ago Encounter for medical examination to establish care   [facility] Leavy Mole, PA-C

## 2024-04-13 NOTE — Telephone Encounter (Signed)
 Called patient and lvm advising

## 2024-05-06 ENCOUNTER — Other Ambulatory Visit: Payer: Self-pay | Admitting: Family Medicine

## 2024-05-06 DIAGNOSIS — E1165 Type 2 diabetes mellitus with hyperglycemia: Secondary | ICD-10-CM

## 2024-05-07 NOTE — Telephone Encounter (Signed)
 Requested medications are due for refill today.  yes  Requested medications are on the active medications list.  yes  Last refill. 04/11/2024 #30 0 rf  Future visit scheduled.   yes  Notes to clinic.  New medication to this pt.    Requested Prescriptions  Pending Prescriptions Disp Refills   metFORMIN  (GLUCOPHAGE -XR) 500 MG 24 hr tablet [Pharmacy Med Name: METFORMIN  HCL ER 500 MG TABLET] 30 tablet 0    Sig: TAKE 1 TABLET BY MOUTH EVERY EVENING.     Endocrinology:  Diabetes - Biguanides Failed - 05/07/2024  5:21 PM      Failed - B12 Level in normal range and within 720 days    No results found for: VITAMINB12       Passed - Cr in normal range and within 360 days    Creat  Date Value Ref Range Status  04/10/2024 0.69 0.50 - 1.03 mg/dL Final   Creatinine, Urine  Date Value Ref Range Status  12/06/2023 14 (L) 20 - 275 mg/dL Final         Passed - HBA1C is between 0 and 7.9 and within 180 days    Hemoglobin A1C  Date Value Ref Range Status  12/09/2012 6.2 4.2 - 6.3 % Final    Comment:    The American Diabetes Association recommends that a primary goal of therapy should be <7% and that physicians should reevaluate the treatment regimen in patients with HbA1c values consistently >8%.    Hgb A1c MFr Bld  Date Value Ref Range Status  04/10/2024 7.9 (H) <5.7 % Final    Comment:    For someone without known diabetes, a hemoglobin A1c value of 6.5% or greater indicates that they may have  diabetes and this should be confirmed with a follow-up  test. . For someone with known diabetes, a value <7% indicates  that their diabetes is well controlled and a value  greater than or equal to 7% indicates suboptimal  control. A1c targets should be individualized based on  duration of diabetes, age, comorbid conditions, and  other considerations. . Currently, no consensus exists regarding use of hemoglobin A1c for diagnosis of diabetes for children. .          Passed - eGFR  in normal range and within 360 days    GFR, Est African American  Date Value Ref Range Status  08/11/2016 >89 >=60 mL/min Final   GFR calc Af Amer  Date Value Ref Range Status  09/20/2017 >60 >60 mL/min Final    Comment:    (NOTE) The eGFR has been calculated using the CKD EPI equation. This calculation has not been validated in all clinical situations. eGFR's persistently <60 mL/min signify possible Chronic Kidney Disease.    GFR, Est Non African American  Date Value Ref Range Status  08/11/2016 89 >=60 mL/min Final   GFR, Estimated  Date Value Ref Range Status  06/08/2023 >60 >60 mL/min Final    Comment:    (NOTE) Calculated using the CKD-EPI Creatinine Equation (2021)    eGFR  Date Value Ref Range Status  04/10/2024 101 > OR = 60 mL/min/1.39m2 Final         Passed - Valid encounter within last 6 months    Recent Outpatient Visits           3 weeks ago Type 2 diabetes mellitus with hyperglycemia, without long-term current use of insulin [facility])   [facility] [place], Laymon SAILOR, [place]  2 months ago Dysfunction of both eustachian tubes   [facility] Bernardo Fend, DO   5 months ago New onset type 2 diabetes mellitus [facility])   [place] [facility] Leavy Mole, PA-C   6 months ago Well adult exam   [facility] Leavy Mole, PA-C   9 months ago Encounter for medical examination to establish care   [facility] [place], [place], PA-C              Passed - CBC within normal limits and completed in the last 12 months    WBC  Date Value Ref Range Status  08/08/2023 6.4 3.8 - 10.8 Thousand/uL Final   RBC  Date Value Ref Range Status  08/08/2023 4.88 3.80 - 5.10 Million/uL Final   Hemoglobin  Date Value Ref Range Status  08/08/2023 12.3 11.7 - 15.5 g/dL Final   HGB  Date Value Ref Range Status  12/09/2012 12.4 12.0 - 16.0 g/dL Final   HCT   Date Value Ref Range Status  08/08/2023 38.5 35.0 - 45.0 % Final  12/09/2012 36.5 35.0 - 47.0 % Final   MCHC  Date Value Ref Range Status  08/08/2023 31.9 (L) 32.0 - 36.0 g/dL Final    Comment:    For adults, a slight decrease in the calculated MCHC value (in the range of 30 to 32 g/dL) is most likely not clinically significant; however, it should be interpreted with caution in correlation with other red cell parameters and the patient's clinical condition.    [facility]  Date Value Ref Range Status  08/08/2023 25.2 (L) 27.0 - 33.0 pg Final   MCV  Date Value Ref Range Status  08/08/2023 78.9 (L) 80.0 - 100.0 fL Final  12/09/2012 80 80 - 100 fL Final   No results found for: PLTCOUNTKUC, LABPLAT, POCPLA RDW  Date Value Ref Range Status  08/08/2023 14.0 11.0 - 15.0 % Final  12/09/2012 14.4 11.5 - 14.5 % Final

## 2024-05-11 ENCOUNTER — Ambulatory Visit: Admitting: Family Medicine

## 2024-05-18 ENCOUNTER — Ambulatory Visit: Admitting: Family Medicine

## 2024-06-01 ENCOUNTER — Ambulatory Visit: Admitting: Family Medicine
